# Patient Record
Sex: Female | Born: 1970 | Race: Black or African American | Hispanic: No | Marital: Single | Smoking: Never smoker
Health system: Southern US, Community
[De-identification: ages and names within clinical notes are randomized; demographics above are authoritative.]

## PROBLEM LIST (undated history)

## (undated) DIAGNOSIS — H9190 Unspecified hearing loss, unspecified ear: Secondary | ICD-10-CM

## (undated) HISTORY — DX: Unspecified hearing loss, unspecified ear: H91.90

---

## 1999-10-25 HISTORY — PX: REDUCTION MAMMAPLASTY: SUR839

## 2000-10-24 HISTORY — PX: BREAST REDUCTION SURGERY: SHX8

## 2002-10-24 HISTORY — PX: WRIST SURGERY: SHX841

## 2017-09-17 ENCOUNTER — Emergency Department (HOSPITAL_COMMUNITY): Payer: Self-pay

## 2017-09-17 ENCOUNTER — Encounter (HOSPITAL_COMMUNITY): Payer: Self-pay

## 2017-09-17 ENCOUNTER — Other Ambulatory Visit: Payer: Self-pay

## 2017-09-17 ENCOUNTER — Emergency Department (HOSPITAL_COMMUNITY)
Admission: EM | Admit: 2017-09-17 | Discharge: 2017-09-17 | Disposition: A | Discharge status: Home / Self Care | Payer: Self-pay | Attending: Emergency Medicine | Admitting: Emergency Medicine

## 2017-09-17 DIAGNOSIS — H9192 Unspecified hearing loss, left ear: Secondary | ICD-10-CM | POA: Insufficient documentation

## 2017-09-17 DIAGNOSIS — R42 Dizziness and giddiness: Secondary | ICD-10-CM | POA: Insufficient documentation

## 2017-09-17 LAB — CBC WITH DIFFERENTIAL/PLATELET
Basophils Absolute: 0 10*3/uL (ref 0.0–0.1)
Basophils Relative: 0 %
EOS ABS: 0 10*3/uL (ref 0.0–0.7)
EOS PCT: 0 %
HCT: 36 % (ref 36.0–46.0)
Hemoglobin: 11.9 g/dL — ABNORMAL LOW (ref 12.0–15.0)
Lymphocytes Relative: 14 %
Lymphs Abs: 0.8 10*3/uL (ref 0.7–4.0)
MCH: 26.2 pg (ref 26.0–34.0)
MCHC: 33.1 g/dL (ref 30.0–36.0)
MCV: 79.1 fL (ref 78.0–100.0)
MONO ABS: 0.3 10*3/uL (ref 0.1–1.0)
MONOS PCT: 5 %
NEUTROS ABS: 4.7 10*3/uL (ref 1.7–7.7)
NEUTROS PCT: 81 %
PLATELETS: 300 10*3/uL (ref 150–400)
RBC: 4.55 MIL/uL (ref 3.87–5.11)
RDW: 15.4 % (ref 11.5–15.5)
WBC: 5.8 10*3/uL (ref 4.0–10.5)

## 2017-09-17 LAB — BASIC METABOLIC PANEL
ANION GAP: 8 (ref 5–15)
BUN: 10 mg/dL (ref 6–20)
CALCIUM: 8.8 mg/dL — AB (ref 8.9–10.3)
CO2: 23 mmol/L (ref 22–32)
CREATININE: 0.78 mg/dL (ref 0.44–1.00)
Chloride: 106 mmol/L (ref 101–111)
GFR calc Af Amer: >60 mL/min (ref >60)
GFR calc non Af Amer: >60 mL/min (ref >60)
Glucose, Bld: 119 mg/dL — ABNORMAL HIGH (ref 65–99)
Potassium: 3.8 mmol/L (ref 3.5–5.1)
SODIUM: 137 mmol/L (ref 135–145)

## 2017-09-17 LAB — URINALYSIS, ROUTINE W REFLEX MICROSCOPIC
BACTERIA UA: NONE SEEN
Bilirubin Urine: NEGATIVE
GLUCOSE, UA: NEGATIVE mg/dL
Ketones, ur: NEGATIVE mg/dL
LEUKOCYTES UA: NEGATIVE
NITRITE: NEGATIVE
PROTEIN: NEGATIVE mg/dL
SPECIFIC GRAVITY, URINE: 1.008 (ref 1.005–1.030)
pH: 8 (ref 5.0–8.0)

## 2017-09-17 LAB — I-STAT BETA HCG BLOOD, ED (MC, WL, AP ONLY)

## 2017-09-17 MED ORDER — MECLIZINE HCL 25 MG PO TABS: 25.0000 mg | ORAL_TABLET | Freq: Three times a day (TID) | ORAL | 0 refills | Status: DC | PRN

## 2017-09-17 MED ORDER — MECLIZINE HCL 25 MG PO TABS
25.0000 mg | ORAL_TABLET | Freq: Once | ORAL | Status: AC
  Administered 2017-09-17: 25 mg via ORAL
  Filled 2017-09-17: qty 1

## 2017-09-17 MED ORDER — CIPROFLOXACIN-DEXAMETHASONE 0.3-0.1 % OT SUSP: 4.0000 [drp] | Freq: Two times a day (BID) | OTIC | 0 refills | Status: DC

## 2017-09-17 MED ORDER — SODIUM CHLORIDE 0.9 % IV BOLUS (SEPSIS)
1000.0000 mL | Freq: Once | INTRAVENOUS | Status: AC
  Administered 2017-09-17: 1000 mL via INTRAVENOUS

## 2017-09-17 MED ORDER — ONDANSETRON HCL 4 MG/2ML IJ SOLN
4.0000 mg | Freq: Once | INTRAMUSCULAR | Status: AC
  Administered 2017-09-17: 4 mg via INTRAVENOUS
  Filled 2017-09-17: qty 2

## 2017-09-17 MED ORDER — ONDANSETRON HCL 4 MG PO TABS: 4.0000 mg | ORAL_TABLET | Freq: Four times a day (QID) | ORAL | 0 refills | Status: DC

## 2017-09-17 NOTE — ED Triage Notes (Signed)
Pt states that since last Sunday, she has been experiencing L sided otalgia and hearing difficulties. Now patient states that it is difficult to lift her head up. She also had 2 episodes of emesis today. Endorses chills, generalized body aches, and malaise. A&Ox4.

## 2017-09-17 NOTE — ED Notes (Signed)
Patient transported to CT 

## 2017-09-17 NOTE — ED Provider Notes (Signed)
[facility]-EMERGENCY DEPT Provider Note   CSN: [id number] Arrival date & time: 09/17/17  1807     History   Chief Complaint Chief Complaint  Patient presents with  . Otalgia  . Fatigue    HPI Madison Lawrence is a 46 y.o. female who presents with 1 week of left ear pain and decreased hearing.  Patient reports that today, she started experiencing some dizziness, nausea/vomiting.  Patient reports for the last week, she has had left ear pain.  She has tried some over-the-counter medications with no improvement.  Patient also reports decreased hearing sensation.  She describes as a "imbalance." She denies any tinnitus but states that she hears "a dull noisy silence." Patient reports that today, she started dispensing some dizziness that she describes as a room spinning sensation.  Additionally, patient had several episodes of vomiting.  Emesis was nonbloody, nonbilious.  Patient reports that dizziness is worsened with ambulating and with movement from supine to sitting.  Patient also reports movement and had exacerbate symptoms.  Patient reports subjective fever/chills.  She denies any chest pain, difficulty breathing, abdominal pain, speech difficulty, tinnitus, numbness/weakness of her arms or legs, vision changes.  The history is provided by the patient.    History reviewed. No pertinent past medical history.  There are no active problems to display for this patient.    The histories are not reviewed yet. Please review them in the "History" navigator section and refresh this SmartLink.  OB History    No data available       Home Medications    Prior to Admission medications   Medication Sig Start Date End Date Taking? Authorizing Provider  ibuprofen (ADVIL,MOTRIN) 200 MG tablet Take 200 mg by mouth every 6 (six) hours as needed for moderate pain.   Yes [provider]  ciprofloxacin-dexamethasone (CIPRODEX) OTIC suspension Place 4 drops into the left  ear 2 (two) times daily. 09/17/17   Maxwell CaulLayden, Lindsey A, PA-C  meclizine (ANTIVERT) 25 MG tablet Take 1 tablet (25 mg total) by mouth 3 (three) times daily as needed for dizziness. 09/17/17   Maxwell CaulLayden, Lindsey A, PA-C  ondansetron (ZOFRAN) 4 MG tablet Take 1 tablet (4 mg total) by mouth every 6 (six) hours. 09/17/17   Maxwell CaulLayden, Lindsey A, PA-C    Family History History reviewed. No pertinent family history.  Social History Social History   Tobacco Use  . Smoking status: Not on file  Substance Use Topics  . Alcohol use: Not on file  . Drug use: Not on file     Allergies   Patient has no known allergies.   Review of Systems Review of Systems  Constitutional: Negative for fever.  HENT: Positive for ear pain and hearing loss. Negative for tinnitus.   Respiratory: Negative for cough and shortness of breath.   Cardiovascular: Negative for chest pain.  Gastrointestinal: Positive for nausea and vomiting. Negative for abdominal pain.  Genitourinary: Negative for dysuria and hematuria.  Neurological: Positive for dizziness. Negative for headaches.     Physical Exam Updated Vital Signs BP (!) 164/100 (BP Location: Left Arm)   Pulse 76   Temp 98 F (36.7 C) (Oral)   Resp 18   Ht 5' 7.5" (1.715 m)   Wt 104.3 kg (230 lb)   LMP 09/17/2017   SpO2 100%   BMI 35.49 kg/m   Physical Exam  Constitutional: She is oriented to person, place, and time. She appears well-developed and well-nourished.  Sitting comfortably on examination  table  HENT:  Head: Normocephalic and atraumatic.  Mouth/Throat: Oropharynx is clear and moist and mucous membranes are normal.  Eyes: Conjunctivae, EOM and lids are normal. Pupils are equal, round, and reactive to light.  Horizontal nystagmus  Neck: Full passive range of motion without pain.  Cardiovascular: Normal rate, regular rhythm, normal heart sounds and normal pulses. Exam reveals no gallop and no friction rub.  No murmur heard. Pulmonary/Chest:  Effort normal and breath sounds normal.  Abdominal: Soft. Normal appearance. There is no tenderness. There is no rigidity and no guarding.  Musculoskeletal: Normal range of motion.  Neurological: She is alert and oriented to person, place, and time.  Cranial nerves III-XII intact Follows commands, Moves all extremities  5/5 strength to BUE and BLE  Sensation intact throughout all major nerve distributions Normal finger to nose. No dysdiadochokinesia. No pronator drift. No gait abnormalities  Negative Rhomberg No slurred speech. No facial droop.  With right ear covered, patient cannot hear me whispering from her left ear.  If I increase the volume, patient can hear me but states it sounds muffled.  Skin: Skin is warm and dry. Capillary refill takes less than 2 seconds.  Psychiatric: She has a normal mood and affect. Her speech is normal.  Nursing note and vitals reviewed.    ED Treatments / Results  Labs (all labs ordered are listed, but only abnormal results are displayed) Labs Reviewed  URINALYSIS, ROUTINE W REFLEX MICROSCOPIC - Abnormal; Notable for the following components:      Result Value   Color, Urine STRAW (*)    Hgb urine dipstick LARGE (*)    Squamous Epithelial / LPF 0-5 (*)    Crystals PRESENT (*)    All other components within normal limits  BASIC METABOLIC PANEL - Abnormal; Notable for the following components:   Glucose, Bld 119 (*)    Calcium 8.8 (*)    All other components within normal limits  CBC WITH DIFFERENTIAL/PLATELET - Abnormal; Notable for the following components:   Hemoglobin 11.9 (*)    All other components within normal limits  I-STAT BETA HCG BLOOD, ED ([facility], [facility], [facility] ONLY)    EKG  EKG Interpretation None       Radiology Ct Head Wo Contrast  Result Date: 09/17/2017 CLINICAL DATA:  Left-sided otalgia and hearing difficulty since last Sunday. Vomiting. Chills, body aches, and malaise. Vertigo. EXAM: CT HEAD WITHOUT CONTRAST TECHNIQUE:  Contiguous axial images were obtained from the base of the skull through the vertex without intravenous contrast. COMPARISON:  None. FINDINGS: Brain: No evidence of acute infarction, hemorrhage, hydrocephalus, extra-axial collection or mass lesion/mass effect. Vascular: No hyperdense vessel or unexpected calcification. Skull: Normal. Negative for fracture or focal lesion. Sinuses/Orbits: Paranasal sinuses and mastoid air cells are clear. There is mild soft tissue thickening in the left middle 8 year without complete opacification. This could represent inflammatory change. Other: None. IMPRESSION: No acute intracranial abnormalities. Mucosal thickening in the left middle ear may represent inflammatory process. No air-fluid levels. Electronically Signed   By: Burman NievesWilliam  Stevens M.D.   On: 09/17/2017 22:49    Procedures Procedures (including critical care time)  Medications Ordered in ED Medications  sodium chloride 0.9 % bolus 1,000 mL (0 mLs Intravenous Stopped 09/17/17 2143)  ondansetron (ZOFRAN) injection 4 mg (4 mg Intravenous Given 09/17/17 1955)  meclizine (ANTIVERT) tablet 25 mg (25 mg Oral Given 09/17/17 2142)     Initial Impression / Assessment and Plan / ED Course  I have  reviewed the triage vital signs and the nursing notes.  Pertinent labs & imaging results that were available during my care of the patient were reviewed by me and considered in my medical decision making (see chart for details).     46 year old female who presents with 1 week of left-sided ear pain and hearing loss and dizziness, nausea and vomiting that began today.  Patient reports dizziness is a room spinning sensation and is worse with movement of her head. Patient is afebrile, non-toxic appearing, sitting comfortably on examination table. Vital signs reviewed. Patient is hypertensive.  Last time she was seen by primary care doctor, states that she was borderline.  She is on high blood pressure medication.  She does  exhibit some horizontal nystagmus.  Otherwise no abnormal deficits.  Consider vertigo versus dehydration is Mnire's versus labyrinthitis.  History/physical exam and on concerning for mastoiditis or CVA.  Labs and imaging reviewed.  Beta negative.  UA shows hemoglobin.  Patient reports that she is just started her menstrual cycle.  BMP unremarkable.  Low hemoglobin.  No previous for comparison.  Evaluation after medications.  Patient reports that nausea/vomiting have improved.  Patient also reports improvement in dizziness after meclizine.  CT head pending.  Reevaluation.  Patient is able to ambulate in the department without any difficulty.  She denies any symptoms with ambulation.  She has not had any more nausea or vomiting since medications.  She is able to eat in the department without any difficulty.  CT head pending.  CT head negative for any acute abnormality.  Discussed results with patient.  She reports improvement in dizziness and nausea/vomiting.  Able to tolerate p.o. in the part without any difficulty.  Vital signs reviewed.  Patient is still hypertensive.  She does not have a history of hypertension.  She states that she has been seen by her primary care doctor and told that she was borderline hypertensive and but she has not seen him in over a year.  She is not on any medications.  Encourage primary care follow-up for evaluation of blood pressure.  Instructed patient to follow-up with ENT regarding your symptoms and possible evaluation for Mnire's disease. Patient had ample opportunity for questions and discussion. All patient's questions were answered with full understanding. Strict return precautions discussed. Patient expresses understanding and agreement to plan.    Final Clinical Impressions(s) / ED Diagnoses   Final diagnoses:  Vertigo    ED Discharge Orders        Ordered    meclizine (ANTIVERT) 25 MG tablet  3 times daily PRN     09/17/17 2301    ondansetron (ZOFRAN) 4  MG tablet  Every 6 hours     09/17/17 2301    ciprofloxacin-dexamethasone (CIPRODEX) OTIC suspension  2 times daily     09/17/17 2301       Rosana Hoes 09/19/17 1632    Lorre Nick, MD 09/20/17 [phone]

## 2017-09-17 NOTE — Discharge Instructions (Signed)
Use Zofran as needed for nausea and vomiting.  Take meclizine as needed for dizziness.  You can use the eardrops as directed.  Follow-up with referred ENT doctor for further evaluation.  Call their office and arrange for an appointment.  I have included information regarding both vertigo and Mnire's disease.  Return the emergency department any fever, worsening dizziness, vision changes, persistent vomiting despite medications or any other worsening or concerning symptoms.

## 2018-08-09 ENCOUNTER — Ambulatory Visit (INDEPENDENT_AMBULATORY_CARE_PROVIDER_SITE_OTHER): Payer: Medicaid Other | Admitting: Obstetrics

## 2018-08-09 ENCOUNTER — Other Ambulatory Visit (HOSPITAL_COMMUNITY)
Admission: RE | Admit: 2018-08-09 | Discharge: 2018-08-09 | Disposition: A | Discharge status: Home / Self Care | Payer: Medicaid Other | Source: Ambulatory Visit | Attending: Obstetrics | Admitting: Obstetrics

## 2018-08-09 ENCOUNTER — Encounter: Payer: Self-pay | Admitting: Obstetrics

## 2018-08-09 VITALS — BP 142/92 | HR 57 | Ht 67.5 in | Wt 237.6 lb

## 2018-08-09 DIAGNOSIS — Z01419 Encounter for gynecological examination (general) (routine) without abnormal findings: Secondary | ICD-10-CM | POA: Diagnosis not present

## 2018-08-09 DIAGNOSIS — Z1239 Encounter for other screening for malignant neoplasm of breast: Secondary | ICD-10-CM

## 2018-08-09 DIAGNOSIS — E669 Obesity, unspecified: Secondary | ICD-10-CM

## 2018-08-09 DIAGNOSIS — Z0000 Encounter for general adult medical examination without abnormal findings: Secondary | ICD-10-CM | POA: Diagnosis not present

## 2018-08-09 NOTE — Progress Notes (Signed)
Subjective:        Madison Lawrence is a 47 y.o. female here for a routine exam.  Current complaints: None.    Personal health questionnaire:  Is patient Ashkenazi Jewish, have a family history of breast and/or ovarian cancer: no Is there a family history of uterine cancer diagnosed at age < 39, gastrointestinal cancer, urinary tract cancer, family member who is a Personnel officer syndrome-associated carrier: no Is the patient overweight and hypertensive, family history of diabetes, personal history of gestational diabetes, preeclampsia or PCOS: no Is patient over 43, have PCOS,  family history of premature CHD under age 12, diabetes, smoke, have hypertension or peripheral artery disease:  no At any time, has a partner hit, kicked or otherwise hurt or frightened you?: no Over the past 2 weeks, have you felt down, depressed or hopeless?: no Over the past 2 weeks, have you felt little interest or pleasure in doing things?:no   Gynecologic History No LMP recorded. Contraception: condoms Last Pap: 2018. Results were: normal Last mammogram: 2018. Results were: normal  Obstetric History OB History  Gravida Para Term Preterm AB Living  0 0 0 0 0 0  SAB TAB Ectopic Multiple Live Births  0 0 0 0 0    Past Medical History:  Diagnosis Date  . Hearing loss     Past Surgical History:  Procedure Laterality Date  . BREAST REDUCTION SURGERY  2002  . WRIST SURGERY  2004   carpel tunnel     Current Outpatient Medications:  .  meclizine (ANTIVERT) 25 MG tablet, Take 1 tablet (25 mg total) by mouth 3 (three) times daily as needed for dizziness., Disp: 30 tablet, Rfl: 0 .  ondansetron (ZOFRAN) 4 MG tablet, Take 1 tablet (4 mg total) by mouth every 6 (six) hours., Disp: 12 tablet, Rfl: 0 .  ciprofloxacin-dexamethasone (CIPRODEX) OTIC suspension, Place 4 drops into the left ear 2 (two) times daily. (Patient not taking: Reported on 08/09/2018), Disp: 7.5 mL, Rfl: 0 .  ibuprofen (ADVIL,MOTRIN) 200 MG  tablet, Take 200 mg by mouth every 6 (six) hours as needed for moderate pain., Disp: , Rfl:  No Known Allergies  Social History   Tobacco Use  . Smoking status: Never Smoker  . Smokeless tobacco: Never Used  Substance Use Topics  . Alcohol use: Not Currently    Family History  Problem Relation Age of Onset  . High blood pressure Mother   . Diabetes Mother   . Diabetes Father       Review of Systems  Constitutional: negative for fatigue and weight loss Respiratory: negative for cough and wheezing Cardiovascular: negative for chest pain, fatigue and palpitations Gastrointestinal: negative for abdominal pain and change in bowel habits Musculoskeletal:negative for myalgias Neurological: negative for gait problems and tremors Behavioral/Psych: negative for abusive relationship, depression Endocrine: negative for temperature intolerance    Genitourinary:negative for abnormal menstrual periods, genital lesions, hot flashes, sexual problems and vaginal discharge Integument/breast: negative for breast lump, breast tenderness, nipple discharge and skin lesion(s)    Objective:       BP (!) 142/92   Pulse (!) 57   Ht 5' 7.5" (1.715 m)   Wt 237 lb 9.6 oz (107.8 kg)   BMI 36.66 kg/m  General:   alert  Skin:   no rash or abnormalities  Lungs:   clear to auscultation bilaterally  Heart:   regular rate and rhythm, S1, S2 normal, no murmur, click, rub or gallop  Breasts:   normal  without suspicious masses, skin or nipple changes or axillary nodes  Abdomen:  normal findings: no organomegaly, soft, non-tender and no hernia  Pelvis:  External genitalia: normal general appearance Urinary system: urethral meatus normal and bladder without fullness, nontender Vaginal: normal without tenderness, induration or masses Cervix: normal appearance Adnexa: normal bimanual exam Uterus: anteverted and non-tender, normal size   Lab Review Urine pregnancy test Labs reviewed yes Radiologic  studies reviewed yes  50% of 20 min visit spent on counseling and coordination of care.   Assessment:     1. Encounter for routine gynecological examination with Papanicolaou smear of cervix Rx: - Cytology - PAP  2. Screening breast examination Rx: - MM Digital Screening; Future  3. Obesity (BMI 35.0-39.9 without comorbidity) - program of caloric reduction, exercise and behavioral modification recommended    Plan:    Education reviewed: calcium supplements, depression evaluation, low fat, low cholesterol diet, safe sex/STD prevention, self breast exams and weight bearing exercise. Contraception: condoms. Mammogram ordered. Follow up in: 1 year.   No orders of the defined types were placed in this encounter.  Orders Placed This Encounter  Procedures  . MM Digital Screening    Standing Status:   Future    Standing Expiration Date:   10/10/2019    Order Specific Question:   Reason for Exam (SYMPTOM  OR DIAGNOSIS REQUIRED)    Answer:   Screening    Order Specific Question:   Is the patient pregnant?    Answer:   No    Order Specific Question:   Preferred imaging location?    Answer:   [facility] Julio Alm MD 08-09-2018

## 2018-08-09 NOTE — Progress Notes (Signed)
Pt is here for annual. Pt reports last pap 11/2016 normal. Last MMG 2018- normal.

## 2018-08-13 LAB — CYTOLOGY - PAP
Diagnosis: NEGATIVE
HPV (WINDOPATH): NOT DETECTED

## 2018-08-20 ENCOUNTER — Other Ambulatory Visit (HOSPITAL_COMMUNITY): Payer: Self-pay | Admitting: *Deleted

## 2018-08-20 DIAGNOSIS — Z1231 Encounter for screening mammogram for malignant neoplasm of breast: Secondary | ICD-10-CM

## 2018-11-13 ENCOUNTER — Ambulatory Visit (HOSPITAL_COMMUNITY): Payer: Medicaid Other

## 2018-11-14 ENCOUNTER — Other Ambulatory Visit (HOSPITAL_COMMUNITY): Payer: Self-pay | Admitting: *Deleted

## 2018-11-14 DIAGNOSIS — Z1231 Encounter for screening mammogram for malignant neoplasm of breast: Secondary | ICD-10-CM

## 2019-02-21 ENCOUNTER — Ambulatory Visit (HOSPITAL_COMMUNITY): Payer: Medicaid Other

## 2019-06-13 ENCOUNTER — Other Ambulatory Visit: Payer: Self-pay

## 2019-06-13 ENCOUNTER — Ambulatory Visit
Admission: RE | Admit: 2019-06-13 | Discharge: 2019-06-13 | Disposition: A | Discharge status: Home / Self Care | Payer: No Typology Code available for payment source | Source: Ambulatory Visit | Attending: Obstetrics and Gynecology | Admitting: Obstetrics and Gynecology

## 2019-06-13 ENCOUNTER — Ambulatory Visit (HOSPITAL_COMMUNITY)
Admission: RE | Admit: 2019-06-13 | Discharge: 2019-06-13 | Disposition: A | Discharge status: Home / Self Care | Payer: Medicaid Other | Source: Ambulatory Visit | Attending: Obstetrics and Gynecology | Admitting: Obstetrics and Gynecology

## 2019-06-13 ENCOUNTER — Encounter (HOSPITAL_COMMUNITY): Payer: Self-pay

## 2019-06-13 DIAGNOSIS — Z1239 Encounter for other screening for malignant neoplasm of breast: Secondary | ICD-10-CM | POA: Insufficient documentation

## 2019-06-13 DIAGNOSIS — Z1231 Encounter for screening mammogram for malignant neoplasm of breast: Secondary | ICD-10-CM

## 2019-06-13 NOTE — Patient Instructions (Signed)
Explained breast self awareness with Porfirio Oar. Patient did not need a Pap smear today due to last Pap smear and HPV typing was 08/09/2018. Let her know [facility] will cover Pap smears and HPV typing every 5 years unless has a history of abnormal Pap smears. Referred patient to the [facility] for a screening mammogram. Appointment scheduled for Thursday, June 13, 2019 at 1040. Patient aware of appointment and will be there. Let patient know the [facility] will follow up with her within the next couple weeks with results of mammogram by letter or phone. Meliana Douglas verbalized understanding.  Brannock, Arvil Chaco, RN 11:54 AM

## 2019-06-13 NOTE — Progress Notes (Signed)
No complaints today.   Pap Smear: Pap smear not completed today. Last Pap smear was 08/09/2018 at [facility] and normal with negative HPV. Per patient has no history of an abnormal Pap smear. Last Pap smear result is in Epic.  Physical exam: Breasts Breasts symmetrical. Scars observed lower bilateral breasts due to history of breast reduction surgery. No nipple retraction bilateral breasts. No nipple discharge bilateral breasts. No lymphadenopathy. No lumps palpated bilateral breasts. No complaints of pain or tenderness on exam. Referred patient to the [facility] for a screening mammogram. Appointment scheduled for Thursday, June 13, 2019 at 1040.        Pelvic/Bimanual No Pap smear completed today since last Pap smear and HPV typing was 08/09/2018. Pap smear not indicated per B[facility]CP guidelines.   Smoking History: Patient has never smoked.  Patient Navigation: Patient education provided. Access to services provided for patient through [facility]CP program.   Breast and Cervical Cancer Risk Assessment: Patient has no family history of breast cancer, known genetic mutations, or radiation treatment to the chest before age 68. Patient has no history of cervical dysplasia, immunocompromised, or DES exposure in-utero.  Risk Assessment    Risk Scores      06/13/2019   Last edited by: Armond Hang, LPN   5-year risk: 0.7 %   Lifetime risk: 7.1 %

## 2019-06-18 ENCOUNTER — Encounter (HOSPITAL_COMMUNITY): Payer: Self-pay | Admitting: *Deleted

## 2019-07-08 ENCOUNTER — Other Ambulatory Visit: Payer: Self-pay | Admitting: Obstetrics and Gynecology

## 2019-07-08 ENCOUNTER — Other Ambulatory Visit: Payer: Self-pay

## 2019-07-08 ENCOUNTER — Inpatient Hospital Stay: Payer: Self-pay | Attending: Obstetrics and Gynecology | Admitting: *Deleted

## 2019-07-08 VITALS — BP 152/98 | Temp 97.1°F | Ht 66.5 in | Wt 258.0 lb

## 2019-07-08 DIAGNOSIS — Z0000 Encounter for general adult medical examination without abnormal findings: Secondary | ICD-10-CM

## 2019-07-08 NOTE — Progress Notes (Signed)
[facility] initial screening     Clinical Measurement:  Height: 66.5 in Weight: 258 lb  Blood Pressure: 150/102  Blood Pressure #2: 152/98 Fasting Labs Drawn Today, will review with patient when they result.   Medical History:  Patient states that she has a history of high blood pressure. Patient does not have a history of high cholesterol or diabetes.  Medications:  Patient states that she does not take  medication to lower cholesterol, blood pressure or blood sugar.  Patient does not take an aspirin a day to help prevent a heart attack or stroke.    Blood pressure, self measurement: Patient states that she does measure blood pressure from home and has not been told to do so.   Nutrition: Patient states that on average she eats 2  cups of fruit and 2 cups of vegetables per day. Patient states that she does not eat fish at least 2 times per week. Patient eats about half servings of whole grains. Patient does not drink less than 36 ounces of beverages with added sugar weekly. Patient is not currently watching sodium or salt intake. In the past 7 days patient has drank alcohol on two days. On average patient drinks 1 drink containing alcohol on days when she consumes alcohol.  Physical activity:  Patient states that she gets 90 minutes of moderate and 0 minutes of vigorous physical activity each week.  Smoking status:  Patient states that she has never smoked tobacco.   Quality of life:  Over the past 2 weeks patient states that she has not had any days where she has little interest or pleasure in doing things and 0 days where she has felt down, depressed or hopeless.    Risk reduction and counseling:    Health Coaching: Informed patient that since her BP was elevated today I would be referring her for follow-up with Internal Medicine.   Encouraged patient to try and add an extra serving of vegetables into daily diet. Also spoke with patient about reducing the amount of beverages with added  sugars. Patient states that she currently drinks at least 5 glasses of juice a day. Explained that the recommendation is less than 36 ounces or less of beverages with added sugars weekly. I also talked to the patient about watching the amount of sodium that she consumes. Explained to the patient that too much sodium can cause blood pressure to be elevated. Encouraged patient to also try and walk for 20 minutes a day.   Navigation:  I will notify patient of lab results.  Patient is aware of 2 more health coaching sessions and a follow up.  Time: 20 minutes

## 2019-07-09 LAB — GLUCOSE, RANDOM: Glucose: 102 mg/dL — ABNORMAL HIGH (ref 65–99)

## 2019-07-09 LAB — LIPID PANEL W/O CHOL/HDL RATIO
Cholesterol, Total: 196 mg/dL (ref 100–199)
HDL: 51 mg/dL (ref >39)
LDL Chol Calc (NIH): 122 mg/dL — ABNORMAL HIGH (ref 0–99)
Triglycerides: 131 mg/dL (ref 0–149)
VLDL Cholesterol Cal: 23 mg/dL (ref 5–40)

## 2019-07-09 LAB — HGB A1C W/O EAG: Hgb A1c MFr Bld: 5.7 % — ABNORMAL HIGH (ref 4.8–5.6)

## 2019-07-15 ENCOUNTER — Telehealth: Payer: Self-pay

## 2019-07-15 NOTE — Telephone Encounter (Signed)
Left message for patient about [facility] lab results. Left name and number for patient to call back. 

## 2019-07-16 ENCOUNTER — Telehealth: Payer: Self-pay

## 2019-07-16 NOTE — Telephone Encounter (Signed)
Health coaching 2    Labs-196 cholesterol ,  122 LDL cholesterol , 131 triglycerides , 51 HDL cholesterol , 5.7 hemoglobin A1C , 102 mean plasma glucose   Patient understands and is aware of her lab results.   Goals-  Spoke with patient about [facility] lab results. Answered any questions that patient had regarding results. Informed patient that since her BP was elevated during her initial visit and since her hemoglobin A1C and glucose were elevated I would be referring her to [facility] for follow-up.  Goals- Reduce the amount of sweetened beverages that she consumes. Encouraged patient to try and drink less than 36 oz per week. Also encouraged patient to watch the amounts of sweets and sugars that she consumes. Reduce the amount of fried and fatty foods that she consumes. Patient also stated that she has been walking more. Encouraged patient to try and walk for 20 minutes a day.   Navigation:  Patient is aware of 1 more health coaching sessions and a follow up. Patient is scheduled with [facility] on September 30th @ 8:45 am.  Time- 10 minutes

## 2019-07-18 ENCOUNTER — Ambulatory Visit: Payer: No Typology Code available for payment source

## 2019-07-23 ENCOUNTER — Ambulatory Visit: Payer: No Typology Code available for payment source

## 2019-07-24 ENCOUNTER — Ambulatory Visit (INDEPENDENT_AMBULATORY_CARE_PROVIDER_SITE_OTHER): Payer: Self-pay | Admitting: Internal Medicine

## 2019-07-24 ENCOUNTER — Other Ambulatory Visit: Payer: Self-pay

## 2019-07-24 VITALS — BP 160/91 | HR 60 | Temp 98.6°F | Ht 67.5 in | Wt 257.4 lb

## 2019-07-24 DIAGNOSIS — Z0000 Encounter for general adult medical examination without abnormal findings: Secondary | ICD-10-CM

## 2019-07-24 DIAGNOSIS — Z23 Encounter for immunization: Secondary | ICD-10-CM

## 2019-07-24 DIAGNOSIS — I10 Essential (primary) hypertension: Secondary | ICD-10-CM

## 2019-07-24 MED ORDER — AMLODIPINE BESYLATE 5 MG PO TABS: 5.0000 mg | ORAL_TABLET | Freq: Every day | ORAL | 3 refills | Status: DC

## 2019-07-24 NOTE — Patient Instructions (Signed)
Ms. Marovich,  It was nice meeting you! You're blood pressure was elevated today, so I have sent in a prescription for amlodipine to your [facility]. We have gotten some additional blood work from you today, and I will call you with any abnormal results. Please follow-up in 2-3 months to recheck your blood pressure.

## 2019-07-24 NOTE — Assessment & Plan Note (Signed)
Pt received influenza vaccine today. Up to date on PAP smear (08/09/18) and mammogram (06/13/19), both negative.

## 2019-07-24 NOTE — Assessment & Plan Note (Signed)
Pt endorses history of "borderline hypertension" years ago, which was previously controlled with diet and exercise after the pt lost 60lbs. She describes her weight fluctuating since then due to decreased exercise and drinking a lot of sugary beverages. Her BP in the office today is elevated to 160/91. She is interested in making lifestyle modifications again and has begun walking around her apartment complex in the mornings. Discussed with the pt the recommendation to treat the blood pressure with medication while she is also making these lifestyle changes, and the pt was agreeable to this plan.  Assessment - Essential hypertension  Plan - start amlodipine 5mg  daily - ordered BMP today - follow-up in 2 months for BP check  BP Readings from Last 3 Encounters:  07/24/19 (!) 160/91  07/08/19 (!) 152/98  06/13/19 (!) 162/96

## 2019-07-24 NOTE — Progress Notes (Signed)
   CC: elevated BP reading  HPI:  Ms.Davelyn Kohl is a 48 y.o. F with significant PMH as outlined below who presents today tp establish care and follow-up on elevated blood pressure readings. Please see problem-based charting for additional information.  Past Medical History:  Diagnosis Date  . Hearing loss    Social History: Never cigarette smoker, occasional cigar use. Drink EtOH socially, approx once per week. Denies any other substance use. 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   Family History: Mom - HTN, diabetes Dad - diabetes No FH of hyperlipidemia, heart disease, kidney disease, or cancers.  Review of Systems:   Review of Systems  Constitutional: Negative for chills and fever.  HENT: Negative for congestion, sinus pain and sore throat.   Eyes: Negative for blurred vision and double vision.  Respiratory: Negative for cough and shortness of breath.   Cardiovascular: Negative for chest pain.  Gastrointestinal: Negative for abdominal pain, nausea and vomiting.  Genitourinary: Negative for dysuria.  Musculoskeletal: Negative for back pain and joint pain.  Skin: Negative.   Neurological: Negative for dizziness and headaches.   Physical Exam:  Vitals:   07/24/19 0845 07/24/19 0851  BP: (!) 174/110 (!) 160/91  Pulse: (!) 59 60  Temp: 98.6 F (37 C)   TempSrc: Oral   SpO2: 100%   Weight: 257 lb 6.4 oz (116.8 kg)   Height: 5' 7.5" (1.715 m)    Physical Exam Vitals signs and nursing note reviewed.  Constitutional:      General: She is not in acute distress.    Appearance: Normal appearance.  HENT:     Head: Normocephalic and atraumatic.  Neck:     Musculoskeletal: Normal range of motion and neck supple.  Cardiovascular:     Rate and Rhythm: Normal rate and regular rhythm.     Heart sounds: Normal heart sounds.  Pulmonary:     Effort: Pulmonary effort is normal. No respiratory distress.     Breath sounds: Normal breath sounds.  No wheezing, rhonchi or rales.  Abdominal:     General: Abdomen is flat. Bowel sounds are normal.     Palpations: Abdomen is soft.  Musculoskeletal: Normal range of motion.  Skin:    General: Skin is warm and dry.  Neurological:     Mental Status: She is alert.  Psychiatric:        Mood and Affect: Mood normal.    Assessment & Plan:   See Encounters Tab for problem based charting.  Patient seen with Dr. Evette Doffing

## 2019-07-25 LAB — BMP8+ANION GAP
Anion Gap: 12 mmol/L (ref 10.0–18.0)
BUN/Creatinine Ratio: 10 (ref 9–23)
BUN: 11 mg/dL (ref 6–24)
CO2: 23 mmol/L (ref 20–29)
Calcium: 9.3 mg/dL (ref 8.7–10.2)
Chloride: 104 mmol/L (ref 96–106)
Creatinine, Ser: 1.05 mg/dL — ABNORMAL HIGH (ref 0.57–1.00)
GFR calc Af Amer: 73 mL/min/{1.73_m2} (ref >59)
GFR calc non Af Amer: 63 mL/min/{1.73_m2} (ref >59)
Glucose: 96 mg/dL (ref 65–99)
Potassium: 4.9 mmol/L (ref 3.5–5.2)
Sodium: 139 mmol/L (ref 134–144)

## 2019-07-25 LAB — CBC
Hematocrit: 39.8 % (ref 34.0–46.6)
Hemoglobin: 12.4 g/dL (ref 11.1–15.9)
MCH: 25.8 pg — ABNORMAL LOW (ref 26.6–33.0)
MCHC: 31.2 g/dL — ABNORMAL LOW (ref 31.5–35.7)
MCV: 83 fL (ref 79–97)
Platelets: 302 10*3/uL (ref 150–450)
RBC: 4.81 x10E6/uL (ref 3.77–5.28)
RDW: 14.1 % (ref 11.7–15.4)
WBC: 3.8 10*3/uL (ref 3.4–10.8)

## 2019-07-25 NOTE — Progress Notes (Signed)
Internal Medicine Clinic Attending  I saw and evaluated the patient.  I personally confirmed the key portions of the history and exam documented by Dr. Jones and I reviewed pertinent patient test results.  The assessment, diagnosis, and plan were formulated together and I agree with the documentation in the resident's note.     

## 2019-11-23 ENCOUNTER — Other Ambulatory Visit: Payer: Self-pay | Admitting: Internal Medicine

## 2019-12-12 ENCOUNTER — Other Ambulatory Visit: Payer: Self-pay | Admitting: Internal Medicine

## 2019-12-23 ENCOUNTER — Other Ambulatory Visit: Payer: Self-pay

## 2019-12-23 ENCOUNTER — Encounter: Payer: Self-pay | Admitting: Internal Medicine

## 2019-12-23 ENCOUNTER — Ambulatory Visit (INDEPENDENT_AMBULATORY_CARE_PROVIDER_SITE_OTHER): Payer: Self-pay | Admitting: Internal Medicine

## 2019-12-23 VITALS — BP 189/94 | HR 56 | Temp 98.3°F | Ht 67.5 in | Wt 258.1 lb

## 2019-12-23 DIAGNOSIS — Z79899 Other long term (current) drug therapy: Secondary | ICD-10-CM

## 2019-12-23 DIAGNOSIS — Z713 Dietary counseling and surveillance: Secondary | ICD-10-CM

## 2019-12-23 DIAGNOSIS — I10 Essential (primary) hypertension: Secondary | ICD-10-CM

## 2019-12-23 DIAGNOSIS — E669 Obesity, unspecified: Secondary | ICD-10-CM | POA: Insufficient documentation

## 2019-12-23 DIAGNOSIS — Z6839 Body mass index (BMI) 39.0-39.9, adult: Secondary | ICD-10-CM

## 2019-12-23 MED ORDER — AMLODIPINE BESYLATE 5 MG PO TABS
5.0000 mg | ORAL_TABLET | Freq: Every day | ORAL | 1 refills | Status: DC
Start: 2019-12-23 — End: 2020-02-04

## 2019-12-23 MED ORDER — AMLODIPINE BESYLATE 10 MG PO TABS: 10.0000 mg | ORAL_TABLET | Freq: Every day | ORAL | 1 refills | Status: DC

## 2019-12-23 NOTE — Progress Notes (Signed)
Internal Medicine Clinic Attending  Case discussed with Dr. Harbrecht at the time of the visit.  We reviewed the resident's history and exam and pertinent patient test results.  I agree with the assessment, diagnosis, and plan of care documented in the resident's note.   

## 2019-12-23 NOTE — Assessment & Plan Note (Signed)
Hypertension: Patient's BP today is 182/90 and 189/94 on repeat with a goal of <140/80. The patient endorses adherence to her medication regimen with the caveat that she ran out of her medication ~6 days prior. She denied, chest pain, headache, visual changes, lightheadedness, weakness, dizziness on standing, swelling in the feet or ankles.  The prior record blood pressure in December gynecology or her BP was recorded 126/86.  As such I feel the amlodipine 5 mg may have been effective and assume that today's readings are extraordinarily elevated.  Plan: Continue Amlodipine 5mg  daily refilled for 6 months We discussed picking up a sphygmomanometer to record home BP readings. She has agreed to call in approximately 4 to 6 weeks with her home readings at which time we can decide to increase amlodipine or to continue the 5 mg dose A follow-up appointment can be scheduled at that time as well

## 2019-12-23 NOTE — Assessment & Plan Note (Signed)
BMI 39.0-39.9: Discussed ideas regarding exercise and weight loss. She really seems to have the correct dietary and exercise changes in mind. She stated that it simply comes down to being able to make the changes. I encouraged her to make small changes and keep them consistent.   Plan: She is advised to walk daily at least 5-6 times per week She likes the idea of small portion eating I encouraged eating low calorie dense foods to keep full such as vegetables, fruits, salads, chicken and fish.

## 2019-12-23 NOTE — Progress Notes (Signed)
   CC: High blood pressure  HPI:[name] is a 49 y.o. female who presents for evaluation of her high blood pressure treatment. Please see individual problem based A/P for details.  Past Medical History:  Diagnosis Date  . Hearing loss    Review of Systems:   ROS negative except as per HPI.  Physical Exam: Vitals:   12/23/19 0839 12/23/19 0915  BP: (!) 182/90 (!) 189/94  Pulse: (!) 58 (!) 56  Temp: 98.3 F (36.8 C)   TempSrc: Oral   SpO2: 100%   Weight: 258 lb 1.6 oz (117.1 kg)   Height: 5' 7.5" (1.715 m)    Filed Weights   12/23/19 0839  Weight: 258 lb 1.6 oz (117.1 kg)   General: A/O x4, in no acute distress, afebrile, nondiaphoretic HEENT: PEERL, EMO intact Cardio: RRR, no mrg's  Pulmonary: CTA bilaterally, no wheezing or crackles  MSK: BLE nontender, nonedematous Neuro: Alert, conversational, normal gait Psych: Appropriate affect, not depressed in appearance, engages well  Assessment & Plan:   See Encounters Tab for problem based charting.  Patient discussed with Dr. Rogelia Boga

## 2019-12-23 NOTE — Patient Instructions (Addendum)
FOLLOW-UP INSTRUCTIONS When: Call us in about 4-6 weeks with a log of your blood pressure readings at least twice daily.  Return in 6 months or sooner if BP remains elevated What to bring: All of your medications and your blood pressure machine  I have continued the Amlodipine at 5mg  daily.  Today we discussed high blood pressure and weight loss as well as exercise. I do believe that is is important to treat high blood pressure with a medication while you continue the life style changes. Changing how we eat is very difficult but it does sound like you have the right ideas. I agree with walking daily and or an elliptical.   Thank you for your visit to the [facility] today. If you have any questions or concerns please call [facility] at [phone].

## 2020-02-03 ENCOUNTER — Other Ambulatory Visit: Payer: Self-pay

## 2020-02-03 ENCOUNTER — Encounter: Payer: Self-pay | Admitting: Internal Medicine

## 2020-02-03 ENCOUNTER — Telehealth: Payer: Self-pay

## 2020-02-03 NOTE — Telephone Encounter (Signed)
Left message for patient about completing HC 3 for the [facility] program. Left name and number for patient to call back. 

## 2020-02-03 NOTE — Progress Notes (Signed)
This encounter was created in error - please disregard.

## 2020-02-04 ENCOUNTER — Encounter: Payer: Self-pay | Admitting: Internal Medicine

## 2020-02-04 ENCOUNTER — Ambulatory Visit (INDEPENDENT_AMBULATORY_CARE_PROVIDER_SITE_OTHER): Payer: 59 | Admitting: Internal Medicine

## 2020-02-04 VITALS — BP 146/106 | HR 60 | Temp 98.0°F | Ht 67.5 in | Wt 256.8 lb

## 2020-02-04 DIAGNOSIS — E669 Obesity, unspecified: Secondary | ICD-10-CM

## 2020-02-04 DIAGNOSIS — Z79899 Other long term (current) drug therapy: Secondary | ICD-10-CM | POA: Diagnosis not present

## 2020-02-04 DIAGNOSIS — Z6839 Body mass index (BMI) 39.0-39.9, adult: Secondary | ICD-10-CM | POA: Diagnosis not present

## 2020-02-04 DIAGNOSIS — I10 Essential (primary) hypertension: Secondary | ICD-10-CM

## 2020-02-04 MED ORDER — AMLODIPINE BESYLATE 10 MG PO TABS: 10.0000 mg | ORAL_TABLET | Freq: Every day | ORAL | 1 refills | Status: DC

## 2020-02-04 NOTE — Assessment & Plan Note (Signed)
Pt presents today for HTN follow-up. She is asymptomatic and feeling well. Endorses taking amlodipine 5mg  daily. Denies chest pain, palpitations, shortness of breath, dizziness/lightheadednss, headaches, vision changes, or leg swelling. She does not check her BP at home. Pt states her job is creating stress in her life. Works for and has [employer]. In the office today, BP is 154/90 and 146/106 on repeat. This is much improved from last appointment where SBP >180, however not at goal <140/80.  - increase amlodipine to 10mg  daily - encouraged pt to continue lifestyle modifications with exercise and weight loss - clinic follow-up in 4-6 weeks for BP recheck  BP Readings from Last 3 Encounters:  02/04/20 (!) 146/106  12/23/19 (!) 189/94  07/24/19 (!) 160/91

## 2020-02-04 NOTE — Progress Notes (Signed)
   CC: hypertension follow-up  HPI:  [name] is a 49 y.o. F with significant PMH of HTN and obesity, who presents for HTN follow-up. Please see problem-based charting for additional information  Past Medical History:  Diagnosis Date  . Hearing loss    Review of Systems:   Review of Systems  Constitutional: Negative for chills and fever.  Eyes: Negative for blurred vision.  Respiratory: Negative for cough and shortness of breath.   Cardiovascular: Negative for chest pain, palpitations and leg swelling.  Gastrointestinal: Negative for diarrhea, nausea and vomiting.  Neurological: Negative for dizziness and headaches.   Physical Exam:  Vitals:   02/04/20 0925  BP: (!) 154/90  Pulse: 64  Temp: 98 F (36.7 C)  TempSrc: Oral  SpO2: 99%  Weight: 256 lb 12.8 oz (116.5 kg)  Height: 5' 7.5" (1.715 m)   Physical Exam Vitals and nursing note reviewed.  Constitutional:      General: She is not in acute distress.    Appearance: Normal appearance. She is not ill-appearing.  Cardiovascular:     Rate and Rhythm: Normal rate and regular rhythm.     Heart sounds: Normal heart sounds.  Pulmonary:     Effort: Pulmonary effort is normal.     Breath sounds: Normal breath sounds. No wheezing, rhonchi or rales.  Skin:    General: Skin is warm and dry.  Neurological:     Mental Status: She is alert.    Assessment & Plan:   See Encounters Tab for problem based charting.  Patient discussed with Dr. Rogelia Boga

## 2020-02-04 NOTE — Patient Instructions (Signed)
Madison Lawrence,  It was nice seeing you again today! I am glad you are feeling well.  For your blood pressure - please increase your daily amlodipine to 10mg . You may take two 5mg  tablets daily until you run out, and then a prescription for 10mg  tablets will be available for pick-up at your [facility] pharmacy.  Please follow-up in clinic in 4-6 weeks for another blood pressure check.  Thank you for letting be a part of your care!

## 2020-02-10 NOTE — Progress Notes (Signed)
Internal Medicine Clinic Attending  Case discussed with Dr. Jones at the time of the visit.  We reviewed the resident's history and exam and pertinent patient test results.  I agree with the assessment, diagnosis, and plan of care documented in the resident's note.  

## 2020-03-18 ENCOUNTER — Encounter: Payer: Self-pay | Admitting: Internal Medicine

## 2020-03-18 ENCOUNTER — Ambulatory Visit (INDEPENDENT_AMBULATORY_CARE_PROVIDER_SITE_OTHER): Payer: 59 | Admitting: Internal Medicine

## 2020-03-18 ENCOUNTER — Other Ambulatory Visit: Payer: Self-pay

## 2020-03-18 VITALS — BP 144/83 | HR 61 | Temp 98.3°F | Ht 67.0 in | Wt 251.8 lb

## 2020-03-18 DIAGNOSIS — R7303 Prediabetes: Secondary | ICD-10-CM | POA: Diagnosis not present

## 2020-03-18 DIAGNOSIS — I10 Essential (primary) hypertension: Secondary | ICD-10-CM | POA: Diagnosis not present

## 2020-03-18 LAB — POCT GLYCOSYLATED HEMOGLOBIN (HGB A1C): Hemoglobin A1C: 5.7 % — AB (ref 4.0–5.6)

## 2020-03-18 LAB — GLUCOSE, CAPILLARY: Glucose-Capillary: 103 mg/dL — ABNORMAL HIGH (ref 70–99)

## 2020-03-18 MED ORDER — HYDROCHLOROTHIAZIDE 12.5 MG PO CAPS: 12.5000 mg | ORAL_CAPSULE | Freq: Every day | ORAL | 1 refills | Status: DC

## 2020-03-18 NOTE — Patient Instructions (Addendum)
It was a pleasure seeing you in clinic. Today we discussed:   Hypertension: Please continue to take your amlodipine 10mg  daily as prescribed. I am also prescribing HCTZ 12.5mg  daily. Please continue with your healthy, low-sodium diet and regular exercise. I am obtaining some labs today. I will call you if there are any abnormalities. If you need any support, or have any questions, please let know. Follow up in 4-6 weeks for BP check.    If you have any questions or concerns, please call our clinic at [phone] between 9am-5pm and after hours call [phone] and ask for the internal medicine resident on call. If you feel you are having a medical emergency please call 911.   Thank you, we look forward to helping you remain healthy!  To schedule an appointment for a COVID vaccine or be added to the vaccine wait list: Go to [phone]   OR Go to [email or web address]                  OR Call [phone]                                     OR Call [phone] and select Option 2

## 2020-03-18 NOTE — Assessment & Plan Note (Signed)
Patient presenting today for hypertension follow-up.  Blood pressure today 144/83. she is asymptomatic and in good spirits.  She endorses taking amlodipine 10 mg daily.  She denies any headaches, vision changes, dizziness/lightheadedness, chest pain, palpitations, shortness of breath, leg swelling. BP Readings from Last 3 Encounters:  03/18/20 (!) 144/83  02/04/20 (!) 146/106  12/23/19 (!) 189/94   She is still working for [employer] and endorses that this does create some stress in her life.  However, she notes that she is dealing with the stress by creating a brighter home environment.  She also endorses regular exercise and is motivated for continued weight loss. Given her young age, goal BP 120/80.  Plan Continue amlodipine 10 mg daily Start hydrochlorothiazide 12.5 mg daily Encouraged for continue lifestyle modification with exercise and low-sodium diet BMP today

## 2020-03-18 NOTE — Progress Notes (Signed)
   CC: hypertension f/u  HPI:  Ms.Gilberto Rho is a 49 y.o. female with PMHx of hypertension and obesity presenting for follow up of her hypertension. No acute concerns today. Please see problem based charting for full assessment and plan.   Past Medical History:  Diagnosis Date  . Hearing loss    Review of Systems:  Negative except as stated in HPI.  Physical Exam:  Vitals:   03/18/20 0939  BP: (!) 144/83  Pulse: 61  Temp: 98.3 F (36.8 C)  TempSrc: Oral  SpO2: 100%  Weight: 251 lb 12.8 oz (114.2 kg)  Height: 5\' 7"  (1.702 m)   Physical Exam Constitutional:      Appearance: Normal appearance. She is not ill-appearing or diaphoretic.  HENT:     Mouth/Throat:     Mouth: Mucous membranes are moist.     Pharynx: Oropharynx is clear.  Cardiovascular:     Rate and Rhythm: Normal rate and regular rhythm.     Pulses: Normal pulses.     Heart sounds: Normal heart sounds.  Pulmonary:     Effort: Pulmonary effort is normal. No respiratory distress.     Breath sounds: Normal breath sounds. No wheezing.  Abdominal:     General: Bowel sounds are normal. There is no distension.     Palpations: Abdomen is soft.     Tenderness: There is no abdominal tenderness.  Skin:    General: Skin is warm and dry.     Capillary Refill: Capillary refill takes less than 2 seconds.  Neurological:     General: No focal deficit present.     Mental Status: She is alert and oriented to person, place, and time. Mental status is at baseline.      Assessment & Plan:   See Encounters Tab for problem based charting.  Patient discussed with Dr. 

## 2020-03-18 NOTE — Assessment & Plan Note (Signed)
Hemoglobin A1c 5.7.  Patient encouraged for lifestyle modification.  Plan Continue lifestyle modification with weight loss Lipid panel

## 2020-03-18 NOTE — Progress Notes (Signed)
Internal Medicine Clinic Attending  Case discussed with Dr. Aslam at the time of the visit.  We reviewed the resident's history and exam and pertinent patient test results.  I agree with the assessment, diagnosis, and plan of care documented in the resident's note.  

## 2020-03-19 LAB — LIPID PANEL
Chol/HDL Ratio: 4.6 ratio — ABNORMAL HIGH (ref 0.0–4.4)
Cholesterol, Total: 210 mg/dL — ABNORMAL HIGH (ref 100–199)
HDL: 46 mg/dL (ref >39)
LDL Chol Calc (NIH): 143 mg/dL — ABNORMAL HIGH (ref 0–99)
Triglycerides: 116 mg/dL (ref 0–149)
VLDL Cholesterol Cal: 21 mg/dL (ref 5–40)

## 2020-03-19 LAB — BMP8+ANION GAP
Anion Gap: 13 mmol/L (ref 10.0–18.0)
BUN/Creatinine Ratio: 15 (ref 9–23)
BUN: 12 mg/dL (ref 6–24)
CO2: 23 mmol/L (ref 20–29)
Calcium: 9 mg/dL (ref 8.7–10.2)
Chloride: 102 mmol/L (ref 96–106)
Creatinine, Ser: 0.79 mg/dL (ref 0.57–1.00)
GFR calc Af Amer: 103 mL/min/{1.73_m2} (ref >59)
GFR calc non Af Amer: 89 mL/min/{1.73_m2} (ref >59)
Glucose: 97 mg/dL (ref 65–99)
Potassium: 4.5 mmol/L (ref 3.5–5.2)
Sodium: 138 mmol/L (ref 134–144)

## 2020-05-22 ENCOUNTER — Other Ambulatory Visit: Payer: Self-pay | Admitting: Internal Medicine

## 2020-05-22 DIAGNOSIS — I10 Essential (primary) hypertension: Secondary | ICD-10-CM

## 2020-06-12 ENCOUNTER — Other Ambulatory Visit: Payer: Self-pay

## 2020-06-12 ENCOUNTER — Encounter: Payer: Self-pay | Admitting: Internal Medicine

## 2020-06-12 ENCOUNTER — Ambulatory Visit (INDEPENDENT_AMBULATORY_CARE_PROVIDER_SITE_OTHER): Payer: 59 | Admitting: Internal Medicine

## 2020-06-12 DIAGNOSIS — I10 Essential (primary) hypertension: Secondary | ICD-10-CM

## 2020-06-12 MED ORDER — HYDROCHLOROTHIAZIDE 12.5 MG PO CAPS: 12.5000 mg | ORAL_CAPSULE | Freq: Every day | ORAL | 1 refills | Status: DC

## 2020-06-12 NOTE — Patient Instructions (Addendum)
Thank you for trusting me with your care. To recap, today we discussed the following:   1. Essential hypertension  - hydrochlorothiazide (MICROZIDE) 12.5 MG capsule; Take 1 capsule (12.5 mg total) by mouth daily.  Dispense: 90 capsule; Refill: 1 - BMP8+Anion Gap  2. Prediabetes      - Hemoglobin A1c in 3 months

## 2020-06-12 NOTE — Progress Notes (Signed)
   CC: high blood pressure  HPI:[name] is a 49 y.o. female who presents for evaluation of HTN . Please see individual problem based A/P for details.   Depression, PHQ-9: Based on the patients    Office Visit from 03/18/2020 in [facility]  PHQ-9 Total Score 1     score we have is negative for depresson.  Past Medical History:  Diagnosis Date  . Hearing loss    Review of Systems:   Review of Systems  Constitutional: Positive for weight loss (intentional ). Negative for chills and fever.  Respiratory: Negative for cough and shortness of breath.   Cardiovascular: Negative for chest pain and palpitations.     Physical Exam: Vitals:   06/12/20 1325  BP: 128/82  Pulse: 79  Temp: 98.7 F (37.1 C)  TempSrc: Oral  SpO2: 99%  Weight: 245 lb 11.2 oz (111.4 kg)  Height: 5\' 6"  (1.676 m)     General: NAD, nl appearance HEENT: Normocephalic, atraumatic , Conjunctiva nl  Cardiovascular: Normal rate, regular rhythm.  No murmurs, rubs, or gallops Pulmonary : Equal breath sounds, No wheezes, rales, or rhonchi Abdominal: soft, nontender,  bowel sounds present   Assessment & Plan:   See Encounters Tab for problem based charting.  Patient discussed with Dr. 

## 2020-06-14 ENCOUNTER — Encounter: Payer: Self-pay | Admitting: Internal Medicine

## 2020-06-14 NOTE — Assessment & Plan Note (Addendum)
Hypertension: Patient's BP today is 128/82 with a goal of <140/80. Patient takes Amlodipine and started on HCTZ in May of this year. The patient endorses adherence to their medication regimen. He/She denied, chest pain, headache, visual changes, lightheadedness, weakness, dizziness on standing, swelling in the feet or ankles. She has been working out at [employer] a week. Most recent renal function per BMP as below. BMP Latest Ref Rng & Units 03/18/2020 07/24/2019 07/08/2019  Glucose 65 - 99 mg/dL 97 96 694(W)  BUN 6 - 24 mg/dL 12 11 -  Creatinine 5.46 - 1.00 mg/dL 2.70 3.50(K) -  BUN/Creat Ratio 9 - 23 15 10  -  Sodium 134 - 144 mmol/L 138 139 -  Potassium 3.5 - 5.2 mmol/L 4.5 4.9 -  Chloride 96 - 106 mmol/L 102 104 -  CO2 20 - 29 mmol/L 23 23 -  Calcium 8.7 - 10.2 mg/dL 9.0 9.3 -   Medication changes: No  Plan: Continue Amlodipine 10 mg Continue HCTZ 25 mg daily  BMP today  Addendum: Lab let me know patient skipped lab.

## 2020-06-15 NOTE — Progress Notes (Signed)
Internal Medicine Clinic Attending  Case discussed with Dr. Steen at the time of the visit.  We reviewed the resident's history and exam and pertinent patient test results.  I agree with the assessment, diagnosis, and plan of care documented in the resident's note.  Phi Avans, M.D., Ph.D.  

## 2020-08-26 NOTE — Addendum Note (Signed)
Addended by: Gardenia Phlegm on: 08/26/2020 07:34 PM   Modules accepted: Orders

## 2021-01-11 ENCOUNTER — Other Ambulatory Visit: Payer: Self-pay | Admitting: Internal Medicine

## 2021-01-11 DIAGNOSIS — I10 Essential (primary) hypertension: Secondary | ICD-10-CM

## 2021-01-12 NOTE — Telephone Encounter (Signed)
Called pt. Madison Lawrence for the patient to call back.  If no response a letter will be mailed to the patient.

## 2021-01-13 ENCOUNTER — Encounter: Payer: Self-pay | Admitting: Internal Medicine

## 2021-01-13 NOTE — Telephone Encounter (Signed)
Called patient again. N/A a letter was mailed to the pt.

## 2021-01-14 ENCOUNTER — Telehealth: Payer: Self-pay

## 2021-01-14 NOTE — Telephone Encounter (Signed)
Error

## 2021-01-17 NOTE — Assessment & Plan Note (Addendum)
HYPERTENSION FOLLOW-UP: Current medications: amlodipine 10mg  daily, hctz 12.5mg  daily  Assessment: Blood pressure is at goal in the office today. BP Readings from Last 3 Encounters:  01/18/21 125/79  06/12/20 128/82  03/18/20 (!) 144/83   BMP unremarkable.  Plan -continue current management -follow up 6 mo

## 2021-01-17 NOTE — Progress Notes (Signed)
Office Visit   Patient ID: Madison Lawrence, female    DOB: 24-Aug-1971, 50 y.o.   MRN: [id number]  Subjective:  CC: hypertension, prediabetes  Madison Lawrence is a 50 y.o. year old female with hypertension and pre-diabetes who presents for follow up of chronic medical conditions. She has no acute complaints at today's visit. Please refer to problem based charting for details on assessment and plan.      ACTIVE MEDICATIONS   Outpatient Medications Prior to Visit  Medication Sig Dispense Refill  . amLODipine (NORVASC) 10 MG tablet Take 1 tablet (10 mg total) by mouth daily. 90 tablet 1  . hydrochlorothiazide (MICROZIDE) 12.5 MG capsule TAKE 1 CAPSULE(12.5 MG) BY MOUTH DAILY 90 capsule 1  . ondansetron (ZOFRAN) 4 MG tablet Take 1 tablet (4 mg total) by mouth every 6 (six) hours. (Patient not taking: Reported on 06/13/2019) 12 tablet 0   No facility-administered medications prior to visit.     Objective:   BP 125/79 (BP Location: Left Arm, Patient Position: Sitting, Cuff Size: Large)   Pulse 73   Temp 98.2 F (36.8 C) (Oral)   Ht 5\' 6"  (1.676 m)   Wt 240 lb 1.6 oz (108.9 kg)   LMP 12/21/2020   SpO2 100% Comment: room air  BMI 38.75 kg/m  Wt Readings from Last 3 Encounters:  01/18/21 240 lb 1.6 oz (108.9 kg)  06/12/20 245 lb 11.2 oz (111.4 kg)  03/18/20 251 lb 12.8 oz (114.2 kg)   BP Readings from Last 3 Encounters:  01/18/21 125/79  06/12/20 128/82  03/18/20 (!) 144/83   Physical Exam Constitutional:      Appearance: Normal appearance.  Cardiovascular:     Rate and Rhythm: Normal rate and regular rhythm.  Pulmonary:     Effort: Pulmonary effort is normal.     Breath sounds: Normal breath sounds.  Musculoskeletal:     Right lower leg: No edema.     Left lower leg: No edema.     Health Maintenance:   Health Maintenance  Topic Date Due  . TETANUS/TDAP  Never done  . COLONOSCOPY (Pts 45-22yrs Insurance coverage will need to be confirmed)  Never done  . INFLUENZA  VACCINE  05/24/2020  . MAMMOGRAM  06/12/2020  . COVID-19 Vaccine (3 - Booster for Pfizer series) 01/22/2021  . PAP SMEAR-Modifier  08/09/2021  . Hepatitis C Screening  Completed  . HIV Screening  Completed  . HPV VACCINES  Aged Out     Assessment & Plan:   Problem List Items Addressed This Visit      Cardiovascular and Mediastinum   Essential hypertension (Chronic)    HYPERTENSION FOLLOW-UP: Current medications: amlodipine 10mg  daily, hctz 12.5mg  daily  Assessment: Blood pressure is at goal in the office today. BP Readings from Last 3 Encounters:  01/18/21 125/79  06/12/20 128/82  03/18/20 (!) 144/83   BMP unremarkable.  Plan -continue current management -follow up 6 mo       Relevant Medications   amLODipine (NORVASC) 10 MG tablet   hydrochlorothiazide (MICROZIDE) 12.5 MG capsule   Other Relevant Orders   Lipid panel (Completed)   Basic metabolic panel (Completed)     Other   Healthcare maintenance (Chronic)   Prediabetes - Primary (Chronic)    A1C is 5.5 today. Well controlled on diet and exercise. Continue current management.  -repeat A1C in 1Y      Relevant Orders   POC Hbg A1C (Completed)   Hyperlipidemia (Chronic)    10Y  ASCVD Risk = 3.1%. Statin therapy is not indicated at time. Discussed lifestyle modifications at today's visit.  -repeat lipid panel in 2Y      Relevant Medications   amLODipine (NORVASC) 10 MG tablet   hydrochlorothiazide (MICROZIDE) 12.5 MG capsule    Other Visit Diagnoses    Colon cancer screening       Relevant Orders   Ambulatory referral to Gastroenterology   Breast cancer screening by mammogram       Relevant Orders   MM Digital Screening   High risk heterosexual behavior       Relevant Orders   HIV antibody (with reflex) (Completed)   Hepatitis C antibody (Completed)       Return in about 6 months (around 07/21/2021).   Pt discussed with Dr. Phyllis Ginger, MD Internal Medicine Resident PGY-2 [facility] Pager: [phone] 01/20/2021 11:00 AM

## 2021-01-18 ENCOUNTER — Other Ambulatory Visit: Payer: Self-pay

## 2021-01-18 ENCOUNTER — Ambulatory Visit (INDEPENDENT_AMBULATORY_CARE_PROVIDER_SITE_OTHER): Payer: 59 | Admitting: Internal Medicine

## 2021-01-18 ENCOUNTER — Encounter: Payer: Self-pay | Admitting: Internal Medicine

## 2021-01-18 VITALS — BP 125/79 | HR 73 | Temp 98.2°F | Ht 66.0 in | Wt 240.1 lb

## 2021-01-18 DIAGNOSIS — R7303 Prediabetes: Secondary | ICD-10-CM | POA: Diagnosis not present

## 2021-01-18 DIAGNOSIS — I10 Essential (primary) hypertension: Secondary | ICD-10-CM

## 2021-01-18 DIAGNOSIS — E782 Mixed hyperlipidemia: Secondary | ICD-10-CM

## 2021-01-18 DIAGNOSIS — Z7251 High risk heterosexual behavior: Secondary | ICD-10-CM

## 2021-01-18 DIAGNOSIS — E785 Hyperlipidemia, unspecified: Secondary | ICD-10-CM | POA: Diagnosis not present

## 2021-01-18 DIAGNOSIS — Z1231 Encounter for screening mammogram for malignant neoplasm of breast: Secondary | ICD-10-CM

## 2021-01-18 DIAGNOSIS — Z1211 Encounter for screening for malignant neoplasm of colon: Secondary | ICD-10-CM | POA: Diagnosis not present

## 2021-01-18 DIAGNOSIS — Z0000 Encounter for general adult medical examination without abnormal findings: Secondary | ICD-10-CM

## 2021-01-18 LAB — GLUCOSE, CAPILLARY: Glucose-Capillary: 94 mg/dL (ref 70–99)

## 2021-01-18 LAB — POCT GLYCOSYLATED HEMOGLOBIN (HGB A1C): Hemoglobin A1C: 5.5 % (ref 4.0–5.6)

## 2021-01-18 MED ORDER — AMLODIPINE BESYLATE 10 MG PO TABS: 10.0000 mg | ORAL_TABLET | Freq: Every day | ORAL | 1 refills | Status: DC

## 2021-01-18 MED ORDER — HYDROCHLOROTHIAZIDE 12.5 MG PO CAPS: ORAL_CAPSULE | ORAL | 1 refills | Status: DC

## 2021-01-19 DIAGNOSIS — E785 Hyperlipidemia, unspecified: Secondary | ICD-10-CM | POA: Insufficient documentation

## 2021-01-19 LAB — BASIC METABOLIC PANEL
BUN/Creatinine Ratio: 15 (ref 9–23)
BUN: 12 mg/dL (ref 6–24)
CO2: 23 mmol/L (ref 20–29)
Calcium: 9.5 mg/dL (ref 8.7–10.2)
Chloride: 98 mmol/L (ref 96–106)
Creatinine, Ser: 0.81 mg/dL (ref 0.57–1.00)
Glucose: 86 mg/dL (ref 65–99)
Potassium: 4.6 mmol/L (ref 3.5–5.2)
Sodium: 136 mmol/L (ref 134–144)
eGFR: 89 mL/min/{1.73_m2} (ref >59)

## 2021-01-19 LAB — LIPID PANEL
Chol/HDL Ratio: 4.2 ratio (ref 0.0–4.4)
Cholesterol, Total: 223 mg/dL — ABNORMAL HIGH (ref 100–199)
HDL: 53 mg/dL (ref >39)
LDL Chol Calc (NIH): 143 mg/dL — ABNORMAL HIGH (ref 0–99)
Triglycerides: 149 mg/dL (ref 0–149)
VLDL Cholesterol Cal: 27 mg/dL (ref 5–40)

## 2021-01-19 LAB — HEPATITIS C ANTIBODY: Hep C Virus Ab: <0.1 s/co ratio (ref 0.0–0.9)

## 2021-01-19 LAB — HIV ANTIBODY (ROUTINE TESTING W REFLEX): HIV Screen 4th Generation wRfx: NONREACTIVE

## 2021-01-19 NOTE — Assessment & Plan Note (Signed)
A1C is 5.5 today. Well controlled on diet and exercise. Continue current management.  -repeat A1C in 1Y

## 2021-01-19 NOTE — Assessment & Plan Note (Addendum)
10Y ASCVD Risk = 3.1%. Statin therapy is not indicated at time. Discussed lifestyle modifications at today's visit.  -repeat lipid panel in 2Y

## 2021-01-20 ENCOUNTER — Encounter: Payer: Self-pay | Admitting: Internal Medicine

## 2021-01-23 NOTE — Progress Notes (Signed)
Internal Medicine Clinic Attending  Case discussed with Dr. Christian  At the time of the visit.  We reviewed the resident's history and exam and pertinent patient test results.  I agree with the assessment, diagnosis, and plan of care documented in the resident's note.  

## 2021-06-23 ENCOUNTER — Ambulatory Visit: Payer: 59

## 2021-07-06 ENCOUNTER — Other Ambulatory Visit: Payer: Self-pay

## 2021-07-06 ENCOUNTER — Ambulatory Visit
Admission: RE | Admit: 2021-07-06 | Discharge: 2021-07-06 | Disposition: A | Discharge status: Home / Self Care | Payer: 59 | Source: Ambulatory Visit | Attending: Internal Medicine | Admitting: Internal Medicine

## 2021-07-06 DIAGNOSIS — Z1231 Encounter for screening mammogram for malignant neoplasm of breast: Secondary | ICD-10-CM

## 2021-07-13 ENCOUNTER — Other Ambulatory Visit: Payer: Self-pay

## 2021-07-13 DIAGNOSIS — I10 Essential (primary) hypertension: Secondary | ICD-10-CM

## 2021-07-13 MED ORDER — AMLODIPINE BESYLATE 10 MG PO TABS
10.0000 mg | ORAL_TABLET | Freq: Every day | ORAL | 0 refills | Status: DC
Start: 2021-07-13 — End: 2021-08-10

## 2021-07-13 NOTE — Telephone Encounter (Signed)
Please schedule her a routine visit with me this month. Thank you

## 2021-08-10 ENCOUNTER — Other Ambulatory Visit: Payer: Self-pay

## 2021-08-10 DIAGNOSIS — I10 Essential (primary) hypertension: Secondary | ICD-10-CM

## 2021-08-11 MED ORDER — AMLODIPINE BESYLATE 10 MG PO TABS: 10.0000 mg | ORAL_TABLET | Freq: Every day | ORAL | 0 refills | Status: DC

## 2021-09-01 ENCOUNTER — Encounter: Payer: 59 | Admitting: Internal Medicine

## 2021-09-08 ENCOUNTER — Other Ambulatory Visit: Payer: Self-pay

## 2021-09-08 DIAGNOSIS — I10 Essential (primary) hypertension: Secondary | ICD-10-CM

## 2021-09-08 MED ORDER — AMLODIPINE BESYLATE 10 MG PO TABS: 10.0000 mg | ORAL_TABLET | Freq: Every day | ORAL | 0 refills | Status: DC

## 2021-09-08 MED ORDER — HYDROCHLOROTHIAZIDE 12.5 MG PO CAPS: ORAL_CAPSULE | ORAL | 1 refills | Status: DC

## 2021-09-14 ENCOUNTER — Telehealth: Payer: Self-pay

## 2021-09-14 NOTE — Telephone Encounter (Signed)
Patient returned call. Notified her that amlodipine was sent to [facility] on 09/08/21. She confirmed upcoming appt on 12/6 with PCP.

## 2021-09-14 NOTE — Telephone Encounter (Signed)
Amlodipine was refilled 09/08/21.  Called pt to inform her of this, no answer - left message to call the office.

## 2021-09-14 NOTE — Telephone Encounter (Signed)
amLODipine (NORVASC) 10 MG tablet, REFILL REQUEST @ [id number] - [place], [place] - [address] AT [facility] [address]

## 2021-09-28 ENCOUNTER — Other Ambulatory Visit: Payer: Self-pay

## 2021-09-28 ENCOUNTER — Encounter: Payer: Self-pay | Admitting: Internal Medicine

## 2021-09-28 ENCOUNTER — Ambulatory Visit (INDEPENDENT_AMBULATORY_CARE_PROVIDER_SITE_OTHER): Payer: 59 | Admitting: Internal Medicine

## 2021-09-28 VITALS — BP 123/87 | HR 76 | Temp 99.1°F | Ht 67.0 in | Wt 242.3 lb

## 2021-09-28 DIAGNOSIS — E669 Obesity, unspecified: Secondary | ICD-10-CM

## 2021-09-28 DIAGNOSIS — R7303 Prediabetes: Secondary | ICD-10-CM | POA: Diagnosis not present

## 2021-09-28 DIAGNOSIS — I10 Essential (primary) hypertension: Secondary | ICD-10-CM

## 2021-09-28 DIAGNOSIS — Z0000 Encounter for general adult medical examination without abnormal findings: Secondary | ICD-10-CM

## 2021-09-28 DIAGNOSIS — Z23 Encounter for immunization: Secondary | ICD-10-CM | POA: Diagnosis not present

## 2021-09-28 DIAGNOSIS — E66812 Obesity, class 2: Secondary | ICD-10-CM

## 2021-09-28 LAB — POCT GLYCOSYLATED HEMOGLOBIN (HGB A1C): Hemoglobin A1C: 5.8 % — AB (ref 4.0–5.6)

## 2021-09-28 LAB — GLUCOSE, CAPILLARY: Glucose-Capillary: 96 mg/dL (ref 70–99)

## 2021-09-28 MED ORDER — HYDROCHLOROTHIAZIDE 12.5 MG PO CAPS: ORAL_CAPSULE | ORAL | 1 refills | Status: DC

## 2021-09-28 MED ORDER — AMLODIPINE BESYLATE 10 MG PO TABS: 10.0000 mg | ORAL_TABLET | Freq: Every day | ORAL | 1 refills | Status: DC

## 2021-09-28 NOTE — Progress Notes (Addendum)
Office Visit   Patient ID: Madison Lawrence, female    DOB: [birth year], 50 y.o.   MRN: [id number]   PCP: Elige Radon, MD   Subjective:  CC: Follow-up (FOLLOW UP / MEDICATION REFILL / ), Prediabetes, Hypertension, and Obesity   Madison Lawrence is a 50 y.o. year old female who presents for the above medical conditions. Please refer to problem based charting for assessment and plan.  Objective:   BP 123/87 (BP Location: Left Arm, Patient Position: Standing, Cuff Size: Normal)   Pulse 76   Temp 99.1 F (37.3 C) (Oral)   Ht 5\' 7"  (1.702 m)   Wt 242 lb 4.8 oz (109.9 kg)   SpO2 100%   BMI 37.95 kg/m  BP Readings from Last 3 Encounters:  09/28/21 123/87  01/18/21 125/79  06/12/20 128/82   General: Well-appearing female in no distress Cardiac: Regular rate and rhythm Pulm: Lungs clear throughout Skin: No rash or lesion on limited exam  Assessment & Plan:   Problem List Items Addressed This Visit       Cardiovascular and Mediastinum   Essential hypertension - Primary (Chronic)    HYPERTENSION FOLLOW-UP: Current medications: Amlodipine 10 mg daily, hydrochlorothiazide 12.5 mg daily Med Adherence: [x]  Yes    []  No Medication side effects: []  Yes    [x]  No Adherence with salt restriction: [x]  Yes    []  No  Assessment: Blood pressure is at goal in the office today. BP Readings from Last 3 Encounters:  09/28/21 123/87  01/18/21 125/79  06/12/20 128/82    Plan -BMP -continue current management -follow up 6 mo       Relevant Medications   amLODipine (NORVASC) 10 MG tablet   hydrochlorothiazide (MICROZIDE) 12.5 MG capsule   Other Relevant Orders   Basic metabolic panel (Completed)     Other   Healthcare maintenance (Chronic)    She reports having a colonoscopy fairly recently however was unable to recall the name of the location where she had it done.  She is going to look into it and requests that those results be forwarded to our office. Pneumonia and Tdap vaccination  provided today.      Class 2 obesity (Chronic)    Today's visit primarily focused around obesity management.  She notes that she has struggled with this for several years however is noticed that it is becoming increasingly more difficult to lose weight has she is gotten older.  She notes that she is tried phentermine in the past which worked for her.  Over the past month, she has become more conscious of her dietary habits and has started working out. Assessment: Discussed the multifactorial nature of obesity that requires management from several different angles.  Presented options including referral to healthy weight and wellness versus GLP-1 agonist versus surgery versus conservative management. I do think that she would benefit from a referral to healthy weight and wellness for a detailed evaluation.  Although there is not a contraindication to phentermine, I would consider this a little less favorable given her known hypertension. Plan Start trulicity as noted below Check TSH      Relevant Orders   TSH (Completed)   Prediabetes (Chronic)    A1C 5.8. Lifestyle management has been ineffective. Discussed risk for progression to diabetes and long term complications associated.  She denies known personal or family history of thyroid malignancy or MEN gene mutations. Discussed GLP-1 agonists which she is interested in trying. Reviewed most common adverse effects. -Start Trulicity  0.75mg  x4w, then 1.5mg  weekly thereafter -Referred to Boykin Reaper, Pharm D., for medication education -F/u with me around March 2023      Relevant Orders   POC Hbg A1C (Completed)   Glucose, capillary (Completed)   Amb Referral to Clinical Pharmacist   Other Visit Diagnoses     Immunization due       Relevant Orders   Tdap vaccine greater than or equal to 7yo IM (Completed)   Pneumococcal conjugate vaccine 20-valent (Prevnar 20) (Completed)        Return in about 3 months (around 12/27/2021) for Weight loss  follow up.   Pt discussed with Dr. Leo Rod, MD Internal Medicine Resident PGY-3 [facility] 10/07/2021 3:56 PM

## 2021-09-28 NOTE — Patient Instructions (Signed)
Madison Lawrence, it was a pleasure seeing you today!  Today we discussed:  Weight Loss. It sounds like you are working hard, so keep it up!! -I will call you with the results of your labs -Please let me know if you decide that you would like a referral to the [facility].   Hypertension. Your blood pressure looks good in the office today.   Medical Records. Please have your colonoscopy results faxed to our office at your earliest convenience.   Follow-up: 3 months for weight loss follow up   Please make sure to arrive 15 minutes prior to your next appointment. If you arrive late, you may be asked to reschedule.   We look forward to seeing you next time. Please call our clinic at [phone] if you have any questions or concerns. The best time to call is Monday-Friday from 9am-4pm, but there is someone available 24/7. If after hours or the weekend, call the main hospital number and ask for the Internal Medicine Resident On-Call. If you need medication refills, please notify your pharmacy one week in advance and they will send [place] a request.  Thank you for letting us take part in your care. Wishing you the best!

## 2021-09-28 NOTE — Assessment & Plan Note (Addendum)
A1C 5.8. Lifestyle management has been ineffective. Discussed risk for progression to diabetes and long term complications associated.  She denies known personal or family history of thyroid malignancy or MEN gene mutations. Discussed GLP-1 agonists which she is interested in trying. Reviewed most common adverse effects. -Start Trulicity 0.75mg  x4w, then 1.5mg  weekly thereafter -Referred to Arbutus Ped D., for medication education -F/u with me around March 2023

## 2021-09-28 NOTE — Assessment & Plan Note (Addendum)
Today's visit primarily focused around obesity management.  She notes that she has struggled with this for several years however is noticed that it is becoming increasingly more difficult to lose weight has she is gotten older.  She notes that she is tried phentermine in the past which worked for her.  Over the past month, she has become more conscious of her dietary habits and has started working out. Assessment: Discussed the multifactorial nature of obesity that requires management from several different angles.  Presented options including referral to healthy weight and wellness versus GLP-1 agonist versus surgery versus conservative management. I do think that she would benefit from a referral to healthy weight and wellness for a detailed evaluation.  Although there is not a contraindication to phentermine, I would consider this a little less favorable given her known hypertension. Plan  Start trulicity as noted below  Check TSH

## 2021-09-28 NOTE — Assessment & Plan Note (Signed)
She reports having a colonoscopy fairly recently however was unable to recall the name of the location where she had it done.  She is going to look into it and requests that those results be forwarded to our office. Pneumonia and Tdap vaccination provided today.

## 2021-09-28 NOTE — Assessment & Plan Note (Addendum)
HYPERTENSION FOLLOW-UP: Current medications: Amlodipine 10 mg daily, hydrochlorothiazide 12.5 mg daily Med Adherence: [x]  Yes    []  No Medication side effects: []  Yes    [x]  No Adherence with salt restriction: [x]  Yes    []  No  Assessment: Blood pressure is at goal in the office today. BP Readings from Last 3 Encounters:  09/28/21 123/87  01/18/21 125/79  06/12/20 128/82    Plan -BMP -continue current management -follow up 6 mo

## 2021-09-29 LAB — BASIC METABOLIC PANEL
BUN/Creatinine Ratio: 11 (ref 9–23)
BUN: 9 mg/dL (ref 6–24)
CO2: 21 mmol/L (ref 20–29)
Calcium: 9.3 mg/dL (ref 8.7–10.2)
Chloride: 102 mmol/L (ref 96–106)
Creatinine, Ser: 0.79 mg/dL (ref 0.57–1.00)
Glucose: 87 mg/dL (ref 70–99)
Potassium: 4.1 mmol/L (ref 3.5–5.2)
Sodium: 138 mmol/L (ref 134–144)
eGFR: 92 mL/min/{1.73_m2} (ref >59)

## 2021-09-29 LAB — TSH: TSH: 2.81 u[IU]/mL (ref 0.450–4.500)

## 2021-09-29 MED ORDER — TRULICITY 0.75 MG/0.5ML ~~LOC~~ SOAJ: 0.7500 mg | SUBCUTANEOUS | 0 refills | Status: DC

## 2021-09-29 MED ORDER — TRULICITY 1.5 MG/0.5ML ~~LOC~~ SOAJ: 1.5000 mg | SUBCUTANEOUS | 1 refills | Status: DC

## 2021-09-29 NOTE — Addendum Note (Signed)
Addended by: Elige Radon on: 09/29/2021 06:26 PM   Modules accepted: Orders

## 2021-09-30 ENCOUNTER — Telehealth: Payer: Self-pay

## 2021-09-30 DIAGNOSIS — R7303 Prediabetes: Secondary | ICD-10-CM

## 2021-09-30 NOTE — Telephone Encounter (Signed)
PA  for pt ( TRULICITY 0.75MG  /0.5ML SDP 0.5ML) came through on cover my meds was done and submitted with office notes from 12/6.Marland Kitchen Awaiting approval or denial

## 2021-10-04 ENCOUNTER — Other Ambulatory Visit (HOSPITAL_COMMUNITY): Payer: Self-pay

## 2021-10-04 NOTE — Telephone Encounter (Signed)
DECISION :    The request for coverage for TRULICITY INJ 0.75/0.5, use as directed (2 ml per 28 days), is denied.  This decision is based on health plan criteria for TRULICITY INJ 0.75/0.5. This medicine is covered only if:  All of the following:  (1) You have a diagnosis of type 2 diabetes mellitus confirmed by accepted laboratory testing methodologies per treatment guidelines (for example, A1C greater than or equal to 6.5%, fasting plasma glucose greater than or equal to 126mg /dL and/or 2-hour plasma glucose greater than or equal to 200mg /dL). (2) You have a history of suboptimal response, contraindication or intolerance to metformin (generic Glucophage, Glucophage XR). The information provided does not show that you meet the criteria listed above. Reviewed by: , Rph   ( PT IS NOT DIABETIC ACCORDING TO PROBLEM LIST AND CHART NOTES , MEDICATION WILL NOT BE COVERED )

## 2021-10-05 MED ORDER — METFORMIN HCL 500 MG PO TABS: ORAL_TABLET | ORAL | 0 refills | Status: DC

## 2021-10-05 NOTE — Telephone Encounter (Signed)
Discussed Trulicity prior auth denial with [employer] and offered metformin trial which she is agreeable to. Adverse effects and titration discussed.  -Will start with metformin 500mg  daily for 2 weeks, followed by 500mg  twice daily there after.  -She will continue to work on lifestyle changes as well -Follow up with me in January

## 2021-10-05 NOTE — Assessment & Plan Note (Signed)
09/2021--prior auth for Trulicity declined. Needs step therapy. Metformin started.

## 2021-10-11 ENCOUNTER — Telehealth: Payer: Self-pay

## 2021-10-11 NOTE — Telephone Encounter (Signed)
Requesting to speak with a nurse about metFORMIN (GLUCOPHAGE) 500 MG tablet. Please call back.

## 2021-10-11 NOTE — Progress Notes (Signed)
Internal Medicine Clinic Attending  Case discussed with Dr. Christian  At the time of the visit.  We reviewed the resident's history and exam and pertinent patient test results.  I agree with the assessment, diagnosis, and plan of care documented in the resident's note.  

## 2021-10-11 NOTE — Telephone Encounter (Signed)
Return pt's call - no answer;left message on vm to call the office if she need to speak to triage.

## 2021-10-13 NOTE — Telephone Encounter (Signed)
Pt called and informed of Dr Luciana Axe  response. Stated she will let us know if her symptoms do not subside.

## 2021-10-13 NOTE — Telephone Encounter (Signed)
Call from pt - stated she was started on Metformin and was told by the doctor if she develops an upset stomach to call the office. Pt stated no abd pain but having an extra bowel movement (stated she's usually regular) during the day and 1 episode of diarrhea.

## 2021-10-28 ENCOUNTER — Encounter: Payer: 59 | Admitting: Pharmacist

## 2021-11-09 ENCOUNTER — Other Ambulatory Visit: Payer: Self-pay

## 2021-11-10 ENCOUNTER — Other Ambulatory Visit: Payer: Self-pay | Admitting: Internal Medicine

## 2021-11-10 MED ORDER — METFORMIN HCL 500 MG PO TABS: 500.0000 mg | ORAL_TABLET | Freq: Two times a day (BID) | ORAL | 0 refills | Status: DC

## 2021-11-10 MED ORDER — METFORMIN HCL 500 MG PO TABS: ORAL_TABLET | ORAL | 0 refills | Status: DC

## 2021-11-18 ENCOUNTER — Ambulatory Visit (INDEPENDENT_AMBULATORY_CARE_PROVIDER_SITE_OTHER): Payer: 59 | Admitting: Pharmacist

## 2021-11-18 DIAGNOSIS — E669 Obesity, unspecified: Secondary | ICD-10-CM | POA: Diagnosis not present

## 2021-11-18 MED ORDER — OZEMPIC (0.25 OR 0.5 MG/DOSE) 2 MG/1.5ML ~~LOC~~ SOPN: 0.2500 mg | PEN_INJECTOR | SUBCUTANEOUS | 0 refills | Status: DC

## 2021-11-18 NOTE — Progress Notes (Signed)
° °  Subjective:    Patient ID: Madison Lawrence, female    DOB: 07-09-1971, 51 y.o.   MRN: [id number]  HPI Patient is a 51 y.o. female who presents for first weight management follow up. She is in good spirits and presents without assistance.  She initiated use of metformin one month ago. Reports any undesirable side effects of constant diarrhea despite only taking metformin $RemoveBeforeDE'500mg'CqCmhRrfcfzKWOA$  once daily.   Current meal patterns: Smaller portions Breakfast: oatmeal w/ raisins or banana; raisin bread; [place] bacon or sausage  Lunch: salad, applesauce Supper: baked chicken; soup Snacks: sesame sticks, crackers, fruit, cheese Drinks: hot tea, coffee, water, juice  Current exercise: walking  Previously tried weight management pharmacotherapy: phentermine Current weight management pharmacotherapy: metformin  Patient states that She Is/is not: is taking his/her/their: her diabetes medications as prescribed. Patient reports adherence with medications.  Do you feel that your medications are working for you?  yes  Have you been experiencing any side effects to the medications prescribed? yes  Do you have any problems obtaining medications due to transportation or finances?  no     Objective:   Vitals with BMI 09/28/2021 01/18/2021 06/12/2020  Height $Remov'5\' 7"'ugYhlY$  $Remove'5\' 6"'TEVVeCd$  $RemoveB'5\' 6"'kYYLaqFx$   Weight 242 lbs 5 oz 240 lbs 2 oz 245 lbs 11 oz  BMI 37.94 29.47 65.46  Systolic 503 546 568  Diastolic 87 79 82  Pulse 76 73 79     Lab Results  Component Value Date   HGBA1C 5.8 (A) 09/28/2021   HGBA1C 5.5 01/18/2021   HGBA1C 5.7 (A) 03/18/2020    Assessment/Plan:    Patient has not met goal of at least 5% of body weight loss with comprehensive lifestyle modifications alone in the past 3-6 months. Pharmacotherapy is appropriate to pursue as augmentation. Patient educated on purpose, proper use and potential adverse effects of Ozempic.  Following instruction patient verbalized understanding of treatment plan.    Start Ozempic 0.$RemoveBeforeD'25mg'XTylfbhDQRyNEu$  once  weekly Stop metformin $RemoveBefore'500mg'iMXLJOXgsacQQ$  once daily Over the next 180 days, patient will work with PharmD and primary care provider to work towards 5-10% body weight loss Comprehensive medication review performed, medication list updated in electronic medical record Patient will adhere to dietary modifications Patient will exercise 30 minutes 2x/week with goal to increase towards target of at least 150 minutes of moderate intensity exercise weekly Patient will report any questions or concerns to provider

## 2021-11-18 NOTE — Patient Instructions (Signed)
Madison Lawrence it was a pleasure seeing you today!  Today we discussed a plan for weight management to work towards your goal of 170-180 lbs  START Ozempic 0.25mg  once weekly and stop metformin Exercise goal: work towards a total of 150 minutes a week (30 minutes 5 days a week) Diet goal: work towards decreasing your carbohydrates and sugary foods/drink  Follow-up with Dr. Ephriam Knuckles  If you have any questions or concerns before your next appointment please call the clinic

## 2022-01-22 NOTE — Progress Notes (Signed)
? ?  Office Visit ? ? Patient ID: Madison Lawrence, female    DOB: 1970-12-13, 51 y.o.   MRN: [id number]   PCP: Mitzi Hansen, MD  ? ?Subjective:  ? ?Madison Lawrence is a 51 y.o. year old female who presents for hypertension and obesity follow up.  ? ?LOV with me was 09/2021. blood pressure was at goal so we continued on amlodipine and hctz. She was started on trulicity for comanagement of prediabetes and obesity.  ?  ?Interm hx: Unfortunately, insurance denied authorization for trulicity. I had called and spoke with her about this and we decided to trial metformin. She met with Madison Lawrence in January who discontinued metformin and started her on ozempic.  ? ?Today's visit: ?She notes that her insurance did not cover ozempic. She would still like to lose weight.  ?Denies any acute issues at today's visit.  ? ? ?ACTIVE MEDICATIONS  ? ?Outpatient Medications Prior to Visit  ?Medication Sig  ? amLODipine (NORVASC) 10 MG tablet Take 1 tablet (10 mg total) by mouth daily.  ? hydrochlorothiazide (MICROZIDE) 12.5 MG capsule TAKE 1 CAPSULE(12.5 MG) BY MOUTH DAILY  ?  ? ?Objective:  ? ?BP 123/63 (BP Location: Left Arm, Patient Position: Sitting, Cuff Size: Large)   Pulse 68   Temp 99.1 ?F (37.3 ?C) (Oral)   Ht 5' 6.5" (1.689 m)   Wt 243 lb (110.2 kg)   LMP 10/10/2021 (Exact Date)   SpO2 100%   BMI 38.63 kg/m?  ?Wt Readings from Last 3 Encounters:  ?01/25/22 243 lb (110.2 kg)  ?09/28/21 242 lb 4.8 oz (109.9 kg)  ?01/18/21 240 lb 1.6 oz (108.9 kg)  ?  ?BP Readings from Last 3 Encounters:  ?01/25/22 123/63  ?09/28/21 123/87  ?01/18/21 125/79  ? ?General: well appearing, no distress ?Cardiac: RRR ?Pulm: lungs clear throughout. ? ?Assessment & Plan:  ? ?Problem List Items Addressed This Visit   ? ?  ? Cardiovascular and Mediastinum  ? Essential hypertension (Chronic)  ?  HYPERTENSION FOLLOW-UP: ?Current medications: Amlodipine 10 mg daily, hydrochlorothiazide 12.5 mg daily ?Assessment: Blood pressure is at goal in the office today. ?BP  Readings from Last 3 Encounters:  ?01/25/22 123/63  ?09/28/21 123/87  ?01/18/21 125/79  ?  ?Plan ?-continue current management ?-follow up 6 mo ? ?  ?  ?  ? Other  ? Class 2 obesity - Primary (Chronic)  ?  Discussed weight loss again at today's visit. She is still highly motivated.  ?Referred to healthy weight and wellness ?  ?  ? Relevant Orders  ? Amb Ref to Medical Weight Management  ? Prediabetes (Chronic)  ? ? ? ?Return in about 6 months (around 07/27/2022) for Blood pressure follow up. ? ? ?Pt discussed with Dr. Jimmye Norman ? ?Mitzi Hansen, MD ?Internal Medicine Resident PGY-3 ?[facility] ?01/26/2022 9:33 AM  ?  ?

## 2022-01-25 ENCOUNTER — Other Ambulatory Visit: Payer: Self-pay | Admitting: Internal Medicine

## 2022-01-25 ENCOUNTER — Other Ambulatory Visit: Payer: Self-pay

## 2022-01-25 ENCOUNTER — Encounter: Payer: Self-pay | Admitting: Internal Medicine

## 2022-01-25 ENCOUNTER — Ambulatory Visit (INDEPENDENT_AMBULATORY_CARE_PROVIDER_SITE_OTHER): Payer: 59 | Admitting: Internal Medicine

## 2022-01-25 VITALS — BP 123/63 | HR 68 | Temp 99.1°F | Ht 66.5 in | Wt 243.0 lb

## 2022-01-25 DIAGNOSIS — E669 Obesity, unspecified: Secondary | ICD-10-CM | POA: Diagnosis not present

## 2022-01-25 DIAGNOSIS — I10 Essential (primary) hypertension: Secondary | ICD-10-CM

## 2022-01-25 DIAGNOSIS — R7303 Prediabetes: Secondary | ICD-10-CM

## 2022-01-26 ENCOUNTER — Encounter: Payer: Self-pay | Admitting: Internal Medicine

## 2022-01-26 NOTE — Assessment & Plan Note (Signed)
HYPERTENSION FOLLOW-UP: ?Current medications: Amlodipine 10 mg daily, hydrochlorothiazide 12.5 mg daily ?Assessment: Blood pressure is at goal in the office today. ?BP Readings from Last 3 Encounters:  ?01/25/22 123/63  ?09/28/21 123/87  ?01/18/21 125/79  ?  ?Plan ?-continue current management ?-follow up 6 mo ? ?

## 2022-01-26 NOTE — Progress Notes (Signed)
Internal Medicine Clinic Attending  Case discussed with Dr. Christian  At the time of the visit.  We reviewed the resident's history and exam and pertinent patient test results.  I agree with the assessment, diagnosis, and plan of care documented in the resident's note.  

## 2022-01-26 NOTE — Assessment & Plan Note (Addendum)
Discussed weight loss again at today's visit. She is still highly motivated. No significant weight changes from her last office visit.  ?? Referred to healthy weight and wellness ?

## 2022-04-14 ENCOUNTER — Other Ambulatory Visit: Payer: Self-pay | Admitting: *Deleted

## 2022-04-14 DIAGNOSIS — I10 Essential (primary) hypertension: Secondary | ICD-10-CM

## 2022-04-14 MED ORDER — AMLODIPINE BESYLATE 10 MG PO TABS: 10.0000 mg | ORAL_TABLET | Freq: Every day | ORAL | 1 refills | Status: DC

## 2022-04-18 ENCOUNTER — Encounter: Payer: Self-pay | Admitting: *Deleted

## 2022-06-02 ENCOUNTER — Other Ambulatory Visit: Payer: Self-pay | Admitting: Student

## 2022-06-05 NOTE — Progress Notes (Signed)
   CC: Breast Pain   HPI:   [name] is a 51 y.o. female with a past medical history of obesity, hypertension, hyperlipidemia, and prediabetes who presents for left breast pain.  Patient last seen at [facility] on January 25, 2022.     Past Medical History:  Diagnosis Date   Hearing loss      Review of Systems:    Reports left breast discomfort Denies fever, weight loss, redness, swelling, discharge, skin changes    Physical Exam:  Vitals:   06/06/22 0841  BP: 122/81  Pulse: 69  Temp: 98.1 F (36.7 C)  TempSrc: Oral  SpO2: 100%  Weight: 236 lb 1.6 oz (107.1 kg)  Height: 5\' 7"  (1.702 m)    General:   awake and alert, sitting comfortably in chair, cooperative, not in acute distress Skin:   warm and dry, intact without any obvious lesions or scars, no rashes or lesions  Lungs:   normal respiratory effort, breathing unlabored, symmetrical chest rise, no crackles or wheezing Cardiac:   regular rate and rhythm, normal S1 and S2 Breast:   left breast slightly larger than right, tissue density similar in both breasts, negative for lumps, ulcers, skin changes, skin distortion, nipple eczema, retraction, and discharge, no axillary or cervical lymphadenopathy Psychiatric:   mood and affect normal, intelligible speech    Assessment & Plan:   Essential hypertension Has been well controlled on amlodipine and hydrochlorothiazide.  Today in clinic her blood pressure was 122/81.  -Continue current regimen of amlodipine and hydrochlorothiazide   Prediabetes Patient was identified as prediabetic with an A1c of 5.8 in December 2022.   -She has a visit at [facility] scheduled for October 2, check A1c at that time   Class 2 obesity Weight at her last visit in April was 243 pounds, now it is 236 pounds.   -Encourage weight loss through reduced caloric intake and increase in exercise frequency   Breast pain, left Patient started to notice pain in her left breast a couple of weeks ago.   Usually has some abdominal soreness during her menstrual cycle, but this is different.  Describes the pain as achy and it is accompanied by a sensation of tightness.  It is not sharp, dull, or burning. Discomfort comes and goes, it is worse at night but never wakes her up.  He has not noticed any redness, discharge, skin changes, or lumps.  He continues to have regular menstrual cycles and skips only 1 month this year.  Her levels of emotional stress are normal, and she has not had any recent medication changes.  Last mammogram performed in September 2022 was unremarkable. Exam was negative for lumps, ulcers, skin changes, skin distortion, nipple eczema, retraction, and discharge.  There was no axillary or cervical lymphadenopathy. She denies any recent weight loss. Tissue density was the same in both breasts.  Mild tenderness to palpation in the center of her left breast.  -Order diagnostic mammography bilaterally and follow-up for scheduled next visit on 10-2     See Encounters Tab for problem based charting.  Patient seen with Dr. October 2022

## 2022-06-06 ENCOUNTER — Encounter: Payer: Self-pay | Admitting: Student

## 2022-06-06 ENCOUNTER — Ambulatory Visit (INDEPENDENT_AMBULATORY_CARE_PROVIDER_SITE_OTHER): Payer: 59 | Admitting: Student

## 2022-06-06 ENCOUNTER — Other Ambulatory Visit: Payer: Self-pay

## 2022-06-06 VITALS — BP 122/81 | HR 69 | Temp 98.1°F | Ht 67.0 in | Wt 236.1 lb

## 2022-06-06 DIAGNOSIS — I10 Essential (primary) hypertension: Secondary | ICD-10-CM | POA: Diagnosis not present

## 2022-06-06 DIAGNOSIS — N644 Mastodynia: Secondary | ICD-10-CM | POA: Diagnosis not present

## 2022-06-06 DIAGNOSIS — E669 Obesity, unspecified: Secondary | ICD-10-CM

## 2022-06-06 DIAGNOSIS — R7303 Prediabetes: Secondary | ICD-10-CM | POA: Diagnosis not present

## 2022-06-06 DIAGNOSIS — Z6836 Body mass index (BMI) 36.0-36.9, adult: Secondary | ICD-10-CM

## 2022-06-06 NOTE — Assessment & Plan Note (Signed)
Patient was identified as prediabetic with an A1c of 5.8 in December 2022.   -She has a visit at [facility] scheduled for October 2, check A1c at that time

## 2022-06-06 NOTE — Assessment & Plan Note (Signed)
Has been well controlled on amlodipine and hydrochlorothiazide.  Today in clinic her blood pressure was 122/81.  -Continue current regimen of amlodipine and hydrochlorothiazide

## 2022-06-06 NOTE — Patient Instructions (Addendum)
  Thank you, Ms.Yevonne Aline, for allowing [place] to provide your care today. Today we discussed . . .  > Breast Pain       - your breast pain is likely benign, but we have ordered a bilateral diagnostic mammography for a final diagnosis > Hypertension       - your blood pressure today was good, we will discuss this more at your next visit in two months > Prediabetes       - your last A1C was slightly elevated, we will discuss this more at your next visit in two months   I have ordered the following labs for you:  Lab Orders  No laboratory test(s) ordered today      Tests ordered today:  Bilateral Diagnostic Mammography   Referrals ordered today:   Referral Orders  No referral(s) requested today      I have ordered the following medication/changed the following medications:   Stop the following medications: There are no discontinued medications.   Start the following medications: No orders of the defined types were placed in this encounter.     Follow up: 2 months    Remember:  Please come in for your next appointment with Dr Geraldo Pitter scheduled for 10-2. Have your mammography completed before then so that we know the results. See you in October!   Should you have any questions or concerns please call the internal medicine clinic at [phone].     Lajuana Ripple, MD [facility]

## 2022-06-06 NOTE — Assessment & Plan Note (Signed)
Patient started to notice pain in her left breast a couple of weeks ago.  Usually has some abdominal soreness during her menstrual cycle, but this is different.  Describes the pain as achy and it is accompanied by a sensation of tightness.  It is not sharp, dull, or burning. Discomfort comes and goes, it is worse at night but never wakes her up.  He has not noticed any redness, discharge, skin changes, or lumps.  He continues to have regular menstrual cycles and skips only 1 month this year.  Her levels of emotional stress are normal, and she has not had any recent medication changes.  Last mammogram performed in September 2022 was unremarkable. Exam was negative for lumps, ulcers, skin changes, skin distortion, nipple eczema, retraction, and discharge.  There was no axillary or cervical lymphadenopathy. She denies any recent weight loss. Tissue density was the same in both breasts.  Mild tenderness to palpation in the center of her left breast.  -Order diagnostic mammography bilaterally and follow-up for scheduled next visit on 10-2

## 2022-06-06 NOTE — Assessment & Plan Note (Signed)
Weight at her last visit in April was 243 pounds, now it is 236 pounds.   -Encourage weight loss through reduced caloric intake and increase in exercise frequency

## 2022-06-07 NOTE — Progress Notes (Signed)
Internal Medicine Clinic Attending  I saw and evaluated the patient.  I personally confirmed the key portions of the history and exam documented by Dr. Harper and I reviewed pertinent patient test results.  The assessment, diagnosis, and plan were formulated together and I agree with the documentation in the resident's note.  

## 2022-06-09 ENCOUNTER — Other Ambulatory Visit: Payer: Self-pay | Admitting: Student

## 2022-06-09 DIAGNOSIS — N644 Mastodynia: Secondary | ICD-10-CM

## 2022-07-07 ENCOUNTER — Ambulatory Visit: Payer: 59

## 2022-07-07 ENCOUNTER — Ambulatory Visit
Admission: RE | Admit: 2022-07-07 | Discharge: 2022-07-07 | Disposition: A | Discharge status: Home / Self Care | Payer: 59 | Source: Ambulatory Visit | Attending: Internal Medicine | Admitting: Internal Medicine

## 2022-07-07 DIAGNOSIS — N644 Mastodynia: Secondary | ICD-10-CM

## 2022-07-25 ENCOUNTER — Encounter: Payer: Self-pay | Admitting: Student

## 2022-07-25 ENCOUNTER — Ambulatory Visit (INDEPENDENT_AMBULATORY_CARE_PROVIDER_SITE_OTHER): Payer: 59 | Admitting: Student

## 2022-07-25 VITALS — BP 138/76 | HR 67 | Temp 98.5°F | Ht 67.0 in | Wt 241.3 lb

## 2022-07-25 DIAGNOSIS — N644 Mastodynia: Secondary | ICD-10-CM

## 2022-07-25 DIAGNOSIS — R7303 Prediabetes: Secondary | ICD-10-CM | POA: Diagnosis not present

## 2022-07-25 DIAGNOSIS — E785 Hyperlipidemia, unspecified: Secondary | ICD-10-CM | POA: Diagnosis not present

## 2022-07-25 DIAGNOSIS — I10 Essential (primary) hypertension: Secondary | ICD-10-CM

## 2022-07-25 DIAGNOSIS — E669 Obesity, unspecified: Secondary | ICD-10-CM

## 2022-07-25 DIAGNOSIS — Z0000 Encounter for general adult medical examination without abnormal findings: Secondary | ICD-10-CM

## 2022-07-25 DIAGNOSIS — Z6837 Body mass index (BMI) 37.0-37.9, adult: Secondary | ICD-10-CM

## 2022-07-25 LAB — POCT GLYCOSYLATED HEMOGLOBIN (HGB A1C): Hemoglobin A1C: 5.6 % (ref 4.0–5.6)

## 2022-07-25 NOTE — Assessment & Plan Note (Addendum)
Colonoscopy in 03/2021 at [facility] had no polyps removed and they recommended she follow up in 10 years. Needs repeat BMP today.  Discussed smoking and alcohol cessation if desired. Currently smokes 2 cigars a month and drinks 2 glasses of wine twice a week.

## 2022-07-25 NOTE — Progress Notes (Signed)
   CC: Routine follow up for HTN  HPI:  [name] is a 51 y.o. female with past medical history of HTN, Class 2 Obesity, Prediabetes, and Hyperlipidemia who presents for a follow up visit. Her last visit was on 06/06/2022.   Past Medical History:  Diagnosis Date   Hearing loss    Review of Systems:   No chest pain, dyspnea, fever, chills, change in bowel/bladder habits, nausea, vomiting, headaches, syncope, or presyncope.  Physical Exam:  Vitals:   07/25/22 1016 07/25/22 1132  BP: (!) 159/92 138/76  Pulse: 76 67  Temp: 98.5 F (36.9 C)   TempSrc: Oral   SpO2: 100%   Weight: 241 lb 4.8 oz (109.5 kg)   Height: 5\' 7"  (1.702 m)    Constitutional:Well appearing female sitting in a chair. In no acute distress. HENT: Normocephalic Eyes: PERRL, no obvious AV nicking or cotton wool spots on partially visualized retina bilaterally. Cardio:Regular rate and rhythm. No murmurs, rubs, or gallops. Pulm:Clear to auscultation bilaterally. Normal work of breathing on room air. Abdomen: Soft, nontender, no masses, positive bowel sounds.  ZJI:RCVELFYB for extremity edema. Skin:Warm and dry. Neuro:Alert and oriented x3. No focal deficit noted. Psych:Pleasant mood and affect.   Assessment & Plan:   See Encounters Tab for problem based charting.  Patient seen with Dr. Jimmye Norman

## 2022-07-25 NOTE — Assessment & Plan Note (Signed)
BP initially high on arrival at 152/92, recheck after visit at 138/76. No recent changes or missed medications. She had an episode of a weird fuzzy feeling and a floater in her right eye that lasted about 4 hours a few days ago. No headache, presyncope, syncope, chest pain, or dyspnea. No recurrence. Discussed the need to call our clinic or go to the ED if this recurs.  - Continue on amlodipine 10 mg daily and HCTZ 12.5 mg daily - Check your BP when able  - Return for follow up in 6 months

## 2022-07-25 NOTE — Assessment & Plan Note (Addendum)
Improved symptoms since presentation in 05/2022. Mammogram on 07/07/2022 without signs of malignancy. Likely related to breast reduction surgery in 2002. Symptomatic treatment as needed.  - Continue to monitor - Continue with screening annual mammography in 06/2023

## 2022-07-25 NOTE — Assessment & Plan Note (Signed)
Weight at current visit is up from 236 lbs to 241. Discussed lifestyle changes which she is planning to make including exercise now that her apartment gym is reopening and diet changes.   - Proceed with lifestyle modification and discuss progress at next visit

## 2022-07-25 NOTE — Patient Instructions (Addendum)
Thank you, Madison Lawrence, for allowing [place] to provide your care today. Today we discussed . . .   > Hypertension       - your blood pressure today was good on the recheck, we will continue to monitor this but we won't make any medication changes today.  > Hyperlipidemia       - we rechecked your lipid panel today and will call with the results. We discussed starting atorvastatin if your cholesterol is still elevated and will contact you about this when we have the results. > Prediabetes       - your last A1C was slightly elevated, we drew another A1C today and will call you with the results > Breast Pain       - your breast pain is likely benign, but we have ordered a bilateral diagnostic mammography for a final diagnosis > Obesity       - we discussed lifestyle modifications today including restarting an exercise routine and making diet changes to decrease sugars and processed foods. If you need any additional assistance please don't hesitate to call the clinic. > Vaccinations       - please continue with your current plan to get the COVID, Flu, and Shingles vaccine at your convenience. Call if you have any questions or issues with getting the vaccines.     I have ordered the following labs for you:   Hemoglobin A1C, Lipid Panel, Basic Metabolic Panel     Tests ordered today:   None     Referrals ordered today:    Referral Orders  No referral(s) requested today        I have ordered the following medication/changed the following medications:    Stop the following medications: There are no discontinued medications.    Start the following medications: No orders of the defined types were placed in this encounter.       Follow up: 6 months     Remember:  If you have any new or worsening issues or want any help with managing anything please call the clinic.     Should you have any questions or concerns please call the internal medicine clinic at [phone].

## 2022-07-25 NOTE — Assessment & Plan Note (Addendum)
A1c 5.8 in 09/2021.  - Repeat A1c 5.6 today.

## 2022-07-25 NOTE — Assessment & Plan Note (Signed)
10 Year ASCVD Risk Score of 5.4% based on lipid panel from 12/2020 with total cholesterol of 223. Discussed the need for a moderate intensity statin based on this. We will recheck lipid panel today and proceed based on these results.   - Lipid panel today, if still indicated start atorvastatin 20 mg daily

## 2022-07-26 ENCOUNTER — Other Ambulatory Visit: Payer: Self-pay | Admitting: Student

## 2022-07-26 DIAGNOSIS — E782 Mixed hyperlipidemia: Secondary | ICD-10-CM

## 2022-07-26 LAB — LIPID PANEL
Chol/HDL Ratio: 4.1 ratio (ref 0.0–4.4)
Cholesterol, Total: 204 mg/dL — ABNORMAL HIGH (ref 100–199)
HDL: 50 mg/dL (ref >39)
LDL Chol Calc (NIH): 132 mg/dL — ABNORMAL HIGH (ref 0–99)
Triglycerides: 120 mg/dL (ref 0–149)
VLDL Cholesterol Cal: 22 mg/dL (ref 5–40)

## 2022-07-26 LAB — BASIC METABOLIC PANEL
BUN/Creatinine Ratio: 13 (ref 9–23)
BUN: 11 mg/dL (ref 6–24)
CO2: 24 mmol/L (ref 20–29)
Calcium: 9.2 mg/dL (ref 8.7–10.2)
Chloride: 101 mmol/L (ref 96–106)
Creatinine, Ser: 0.83 mg/dL (ref 0.57–1.00)
Glucose: 94 mg/dL (ref 70–99)
Potassium: 4.2 mmol/L (ref 3.5–5.2)
Sodium: 139 mmol/L (ref 134–144)
eGFR: 86 mL/min/{1.73_m2} (ref >59)

## 2022-07-26 MED ORDER — ATORVASTATIN CALCIUM 20 MG PO TABS: 20.0000 mg | ORAL_TABLET | Freq: Every day | ORAL | 11 refills | Status: DC

## 2022-07-26 NOTE — Progress Notes (Signed)
Called the patient and discussed her lab results with her including her unremarkable BMP and her improved A1c of 5.6.  Also discussed her stable lipid profile and the need for a statin at this time.  Patient understands and did not have any questions.  We will send this medication to her pharmacy.

## 2022-07-27 LAB — GLUCOSE, CAPILLARY: Glucose-Capillary: 109 mg/dL — ABNORMAL HIGH (ref 70–99)

## 2022-07-27 NOTE — Progress Notes (Signed)
Results discussed with patient during visit as well as on a phone call documented on 07/26/2022. All questions were answered.

## 2022-08-01 NOTE — Progress Notes (Signed)
Internal Medicine Clinic Attending  I saw and evaluated the patient.  I personally confirmed the key portions of the history and exam documented by Dr. Goodwin and I reviewed pertinent patient test results.  The assessment, diagnosis, and plan were formulated together and I agree with the documentation in the resident's note.  

## 2022-09-22 IMAGING — MG MM DIGITAL SCREENING BILAT W/ TOMO AND CAD
8 series · 8 of 24 positions shown · non-contrast
Comparison: Previous exam(s).

ACR Breast Density Category a: The breast tissue is almost entirely
fatty.

CLINICAL DATA: Screening.

EXAM:
DIGITAL SCREENING BILATERAL MAMMOGRAM WITH TOMOSYNTHESIS AND CAD
TECHNIQUE: Bilateral screening digital craniocaudal and mediolateral oblique
mammograms were obtained. Bilateral screening digital breast
tomosynthesis was performed. The images were evaluated with
computer-aided detection.

[R MLO synth-2D]
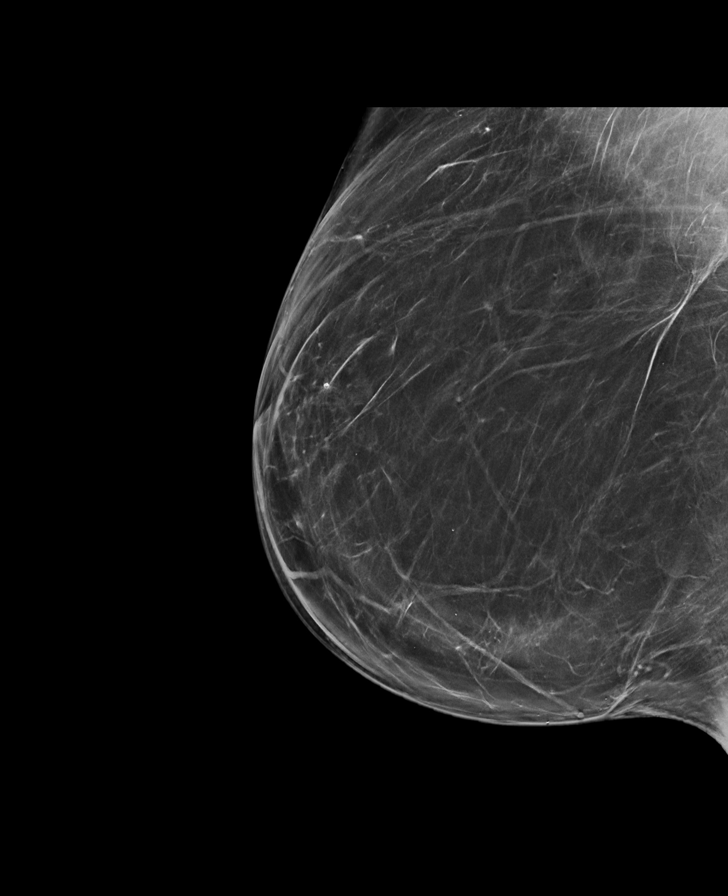

[L CC synth-2D]
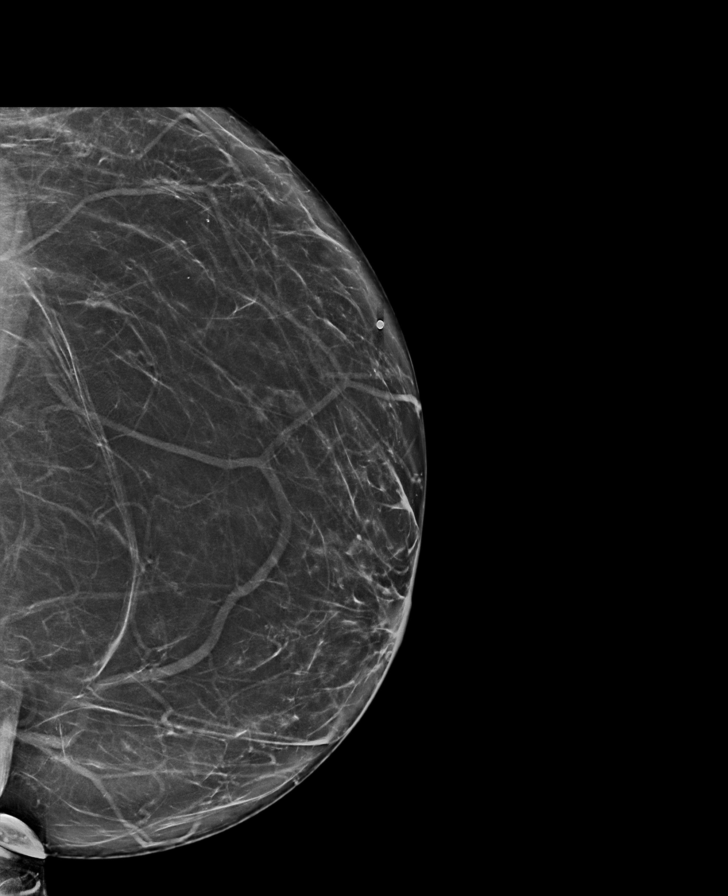

[L MLO synth-2D]
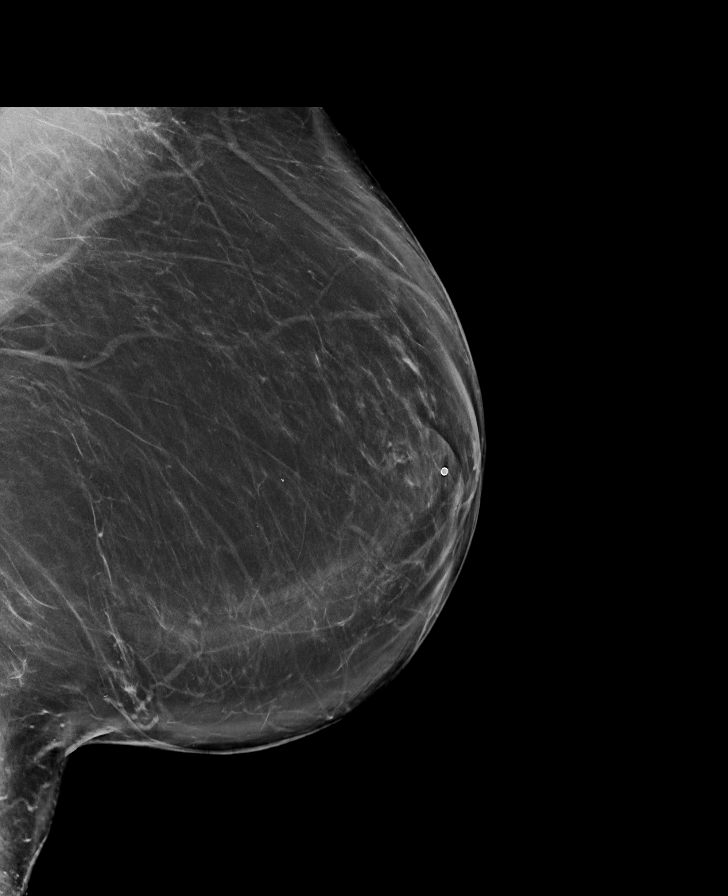

[R CC synth-2D]
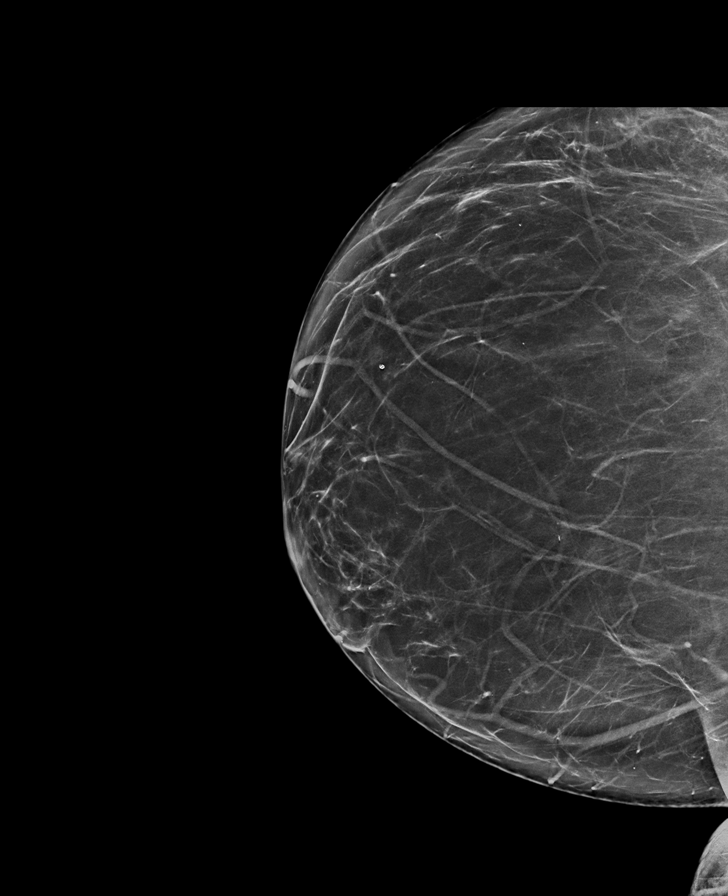

[L CC tomo · tomo slice 41/82.0]
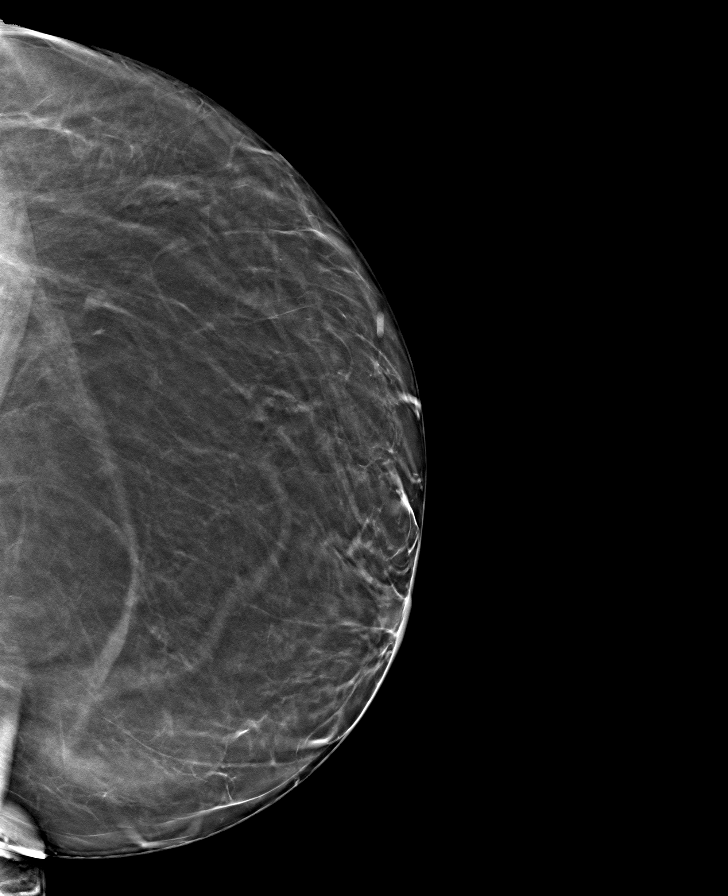

[R CC tomo · tomo slice 39/78.0]
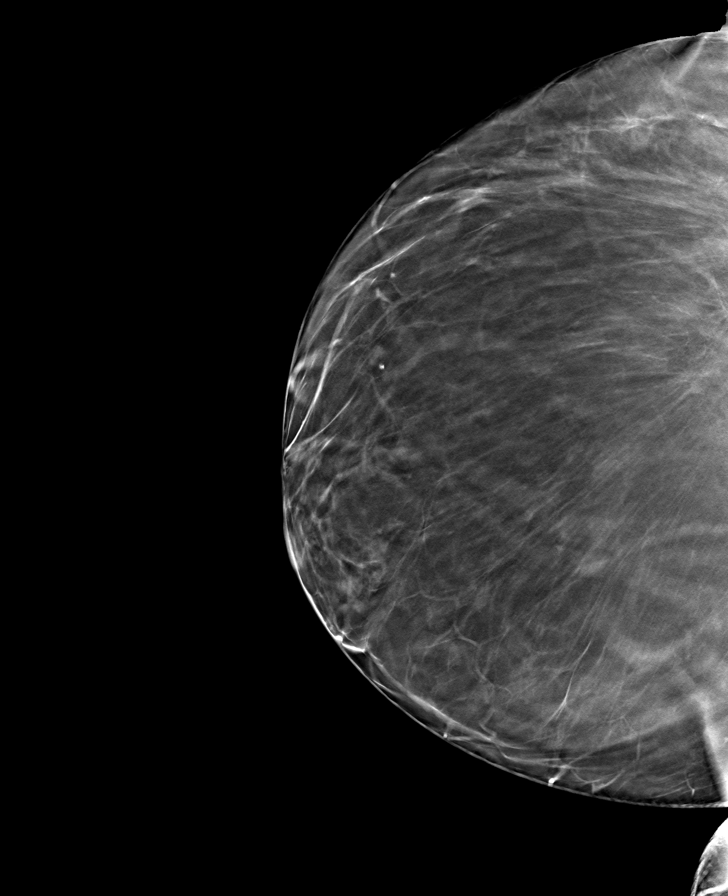

[L MLO tomo · tomo slice 49/98.0]
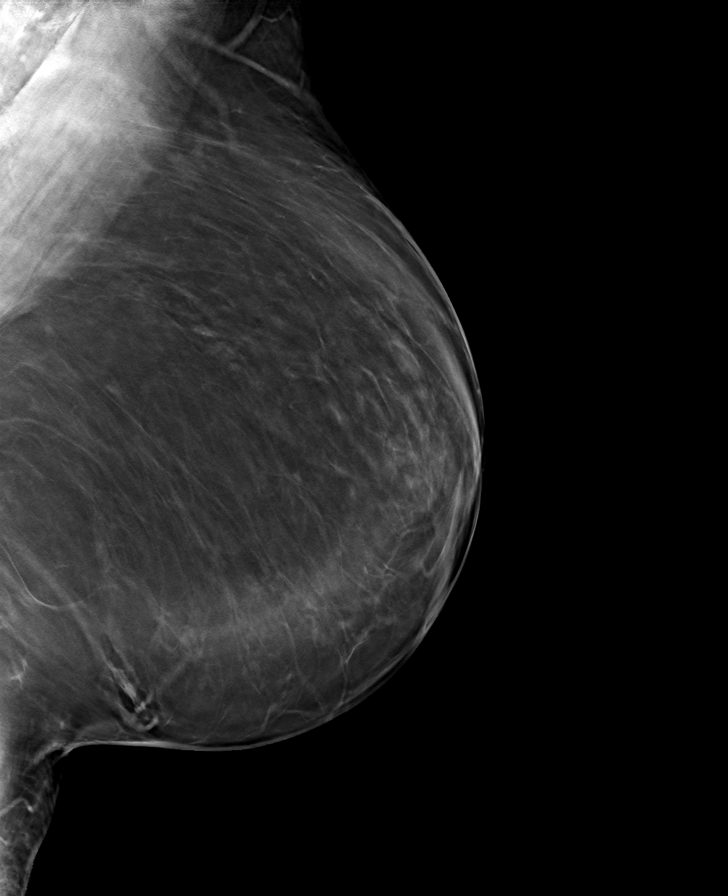

[R MLO tomo · tomo slice 47/94.0]
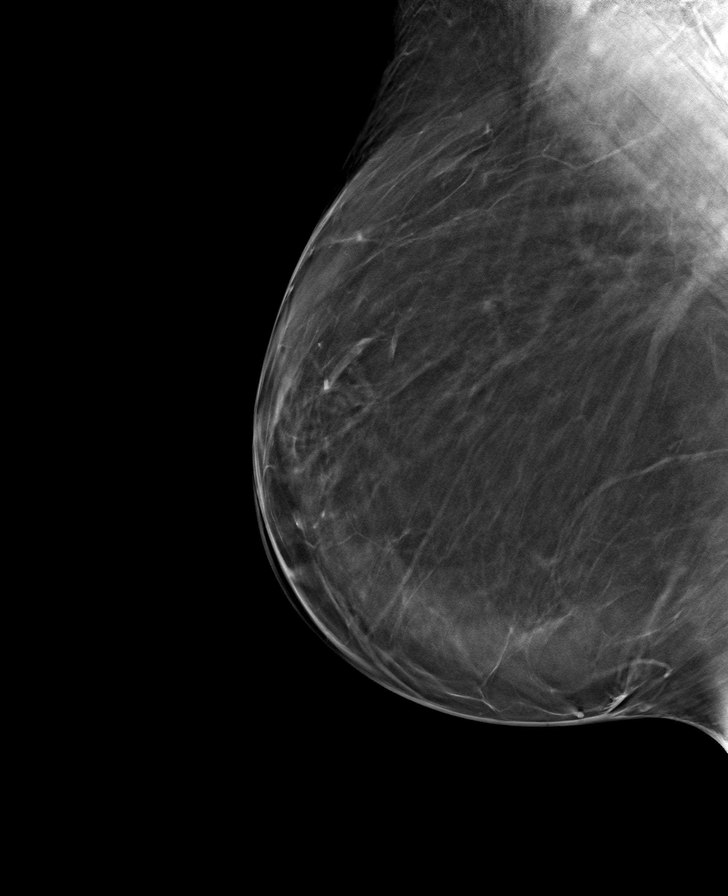

[8 of 24 positions shown; findings below may reference images not displayed]

FINDINGS: There are no findings suspicious for malignancy.
IMPRESSION: No mammographic evidence of malignancy. A result letter of this
screening mammogram will be mailed directly to the patient.

RECOMMENDATION:
Screening mammogram in one year. (Code:0E-3-N98)

BI-RADS CATEGORY  1: Negative.

## 2022-09-27 ENCOUNTER — Other Ambulatory Visit: Payer: Self-pay

## 2022-09-27 DIAGNOSIS — I10 Essential (primary) hypertension: Secondary | ICD-10-CM

## 2022-09-27 MED ORDER — HYDROCHLOROTHIAZIDE 12.5 MG PO CAPS: ORAL_CAPSULE | ORAL | 1 refills | Status: DC

## 2022-09-27 MED ORDER — AMLODIPINE BESYLATE 10 MG PO TABS: 10.0000 mg | ORAL_TABLET | Freq: Every day | ORAL | 1 refills | Status: DC

## 2022-09-27 NOTE — Telephone Encounter (Signed)
hydrochlorothiazide (MICROZIDE) 12.5 MG capsule, REFILL REQUEST @ [id number] - [place], [place] - [address] AT [facility] OF [address]

## 2023-03-26 ENCOUNTER — Other Ambulatory Visit: Payer: Self-pay | Admitting: Student

## 2023-03-26 DIAGNOSIS — I10 Essential (primary) hypertension: Secondary | ICD-10-CM

## 2023-04-12 ENCOUNTER — Other Ambulatory Visit: Payer: Self-pay | Admitting: Student

## 2023-04-12 DIAGNOSIS — I10 Essential (primary) hypertension: Secondary | ICD-10-CM

## 2023-04-20 ENCOUNTER — Ambulatory Visit (INDEPENDENT_AMBULATORY_CARE_PROVIDER_SITE_OTHER): Payer: 59 | Admitting: Student

## 2023-04-20 ENCOUNTER — Encounter: Payer: Self-pay | Admitting: Student

## 2023-04-20 VITALS — BP 123/79 | HR 79 | Ht 67.0 in | Wt 237.8 lb

## 2023-04-20 DIAGNOSIS — N951 Menopausal and female climacteric states: Secondary | ICD-10-CM | POA: Diagnosis not present

## 2023-04-20 DIAGNOSIS — E669 Obesity, unspecified: Secondary | ICD-10-CM

## 2023-04-20 DIAGNOSIS — Z6837 Body mass index (BMI) 37.0-37.9, adult: Secondary | ICD-10-CM

## 2023-04-20 DIAGNOSIS — I10 Essential (primary) hypertension: Secondary | ICD-10-CM | POA: Diagnosis not present

## 2023-04-20 DIAGNOSIS — R7303 Prediabetes: Secondary | ICD-10-CM | POA: Diagnosis not present

## 2023-04-20 DIAGNOSIS — E785 Hyperlipidemia, unspecified: Secondary | ICD-10-CM

## 2023-04-20 DIAGNOSIS — E782 Mixed hyperlipidemia: Secondary | ICD-10-CM

## 2023-04-20 DIAGNOSIS — Z780 Asymptomatic menopausal state: Secondary | ICD-10-CM

## 2023-04-20 MED ORDER — ESTROGENS CONJUGATED 0.625 MG/GM VA CREA: 1.0000 | TOPICAL_CREAM | Freq: Every day | VAGINAL | 12 refills | Status: DC

## 2023-04-20 NOTE — Assessment & Plan Note (Signed)
She is lost 4 pounds from her previous visit in 07/2022.  She is doing well on her diet and has increased the amount of vegetables she is eating.  Encouraged and counseled on calorie deficit and exercise.

## 2023-04-20 NOTE — Assessment & Plan Note (Signed)
Started on atorvastatin 20 mg daily after lipid panel 07/2022 with stable from prior.  No side effects on this medication. - Continue atorvastatin 20 mg daily - Repeat lipid panel today

## 2023-04-20 NOTE — Patient Instructions (Addendum)
  Thank you, Madison Lawrence, for allowing [place] to provide your care today. Today we discussed . . .  > Blood pressure       -Your blood pressure is perfect at 123/79.  We are not can change any medications but we will check your electrolytes and your renal function today with a blood test and I will call you with these results. > Cholesterol       -I am glad that you are doing well on the atorvastatin.  We are going to check your cholesterol today and I will call you with results. > Menopause       -I am very glad that you are symptoms of menopause have been mild so far.  We are going to start a vaginal estrogen cream that you can use daily.  If this is expensive at the pharmacy I would also recommend that you use lubricant especially for sexual intercourse. If you have any issues with this or any worsening symptoms or new symptoms including hot flashes, vaginal bleeding, or otherwise please let us know.   I have ordered the following labs for you:   Lab Orders         BMP8+Anion Gap         Lipid Profile         POC Hbg A1C       Referrals ordered today:   Referral Orders  No referral(s) requested today      I have ordered the following medication/changed the following medications:   Stop the following medications: There are no discontinued medications.   Start the following medications: Meds ordered this encounter  Medications   conjugated estrogens (PREMARIN) vaginal cream    Sig: Place 1 Applicatorful vaginally daily.    Dispense:  42.5 g    Refill:  12      Follow up: 6 months    Remember:     Should you have any questions or concerns please call the internal medicine clinic at [phone].     Rocky Morel, DO [facility]

## 2023-04-20 NOTE — Progress Notes (Signed)
   CC: Routine follow-up  HPI:  Madison Lawrence is a 52 y.o. female with PMH as below who presents to the clinic for routine follow-up.  Please see assessment plan for further details.  Past Medical History:  Diagnosis Date   Hearing loss    Review of Systems:   Pertinent items noted in HPI and/or A&P.  Physical Exam:  Vitals:   04/20/23 0939  BP: 123/79  Pulse: 79  SpO2: 100%  Weight: 237 lb 12.8 oz (107.9 kg)  Height: 5\' 7"  (1.702 m)    Constitutional: Well-appearing middle-aged female. In no acute distress. HEENT: Normocephalic, atraumatic, Sclera non-icteric, PERRL, EOM intact Cardio:Regular rate and rhythm. 2+ bilateral radial and dorsalis pedis  pulses. Pulm:Clear to auscultation bilaterally. Normal work of breathing on room air. Abdomen: Soft, non-tender, non-distended, positive bowel sounds. GHW:EXHBZJIR for extremity edema. Skin:Warm and dry. Neuro:Alert and oriented x3. No focal deficit noted. Psych:Pleasant mood and affect.   Assessment & Plan:   Essential hypertension Blood pressure continues to be well-controlled on current regimen.  Last BMP 07/2022 without abnormalities. - Continue amlodipine 10 mg daily and hydrochlorothiazide 12.5 mg daily - BMP today  Class 2 obesity She is lost 4 pounds from her previous visit in 07/2022.  She is doing well on her diet and has increased the amount of vegetables she is eating.  Encouraged and counseled on calorie deficit and exercise.  Prediabetes A1c 5.6 in 07/2022.  Will repeat today.  If stable will start to do yearly.  Hyperlipidemia Started on atorvastatin 20 mg daily after lipid panel 07/2022 with stable from prior.  No side effects on this medication. - Continue atorvastatin 20 mg daily - Repeat lipid panel today  Menopause Patient had her last menstrual cycle in 09/2022.  Since then she has had some mild vaginal dryness that has resulted in a small amount of vaginal bleeding after an episode of intercourse  but otherwise she has had no spotting, no hot flashes and only mildly labile mood.  Discussed hormonal and nonhormonal treatments.  She is willing to try vaginal estrogen cream.  She also follows with OB/GYN and will discuss her vaginal dryness with them as well. - Estrogen vaginal cream daily    Patient discussed with Dr. Julieanne Cotton, DO [facility] Internal Medicine Resident PGY-1 Pager: [phone]

## 2023-04-20 NOTE — Assessment & Plan Note (Signed)
A1c 5.6 in 07/2022.  Will repeat today.  If stable will start to do yearly.

## 2023-04-20 NOTE — Assessment & Plan Note (Signed)
Blood pressure continues to be well-controlled on current regimen.  Last BMP 07/2022 without abnormalities. - Continue amlodipine 10 mg daily and hydrochlorothiazide 12.5 mg daily - BMP today

## 2023-04-20 NOTE — Assessment & Plan Note (Signed)
Patient had her last menstrual cycle in 09/2022.  Since then she has had some mild vaginal dryness that has resulted in a small amount of vaginal bleeding after an episode of intercourse but otherwise she has had no spotting, no hot flashes and only mildly labile mood.  Discussed hormonal and nonhormonal treatments.  She is willing to try vaginal estrogen cream.  She also follows with OB/GYN and will discuss her vaginal dryness with them as well. - Estrogen vaginal cream daily

## 2023-04-21 ENCOUNTER — Encounter: Payer: Self-pay | Admitting: Student

## 2023-04-21 NOTE — Progress Notes (Signed)
Internal Medicine Clinic Attending  Case discussed with Dr. Goodwin  At the time of the visit.  We reviewed the resident's history and exam and pertinent patient test results.  I agree with the assessment, diagnosis, and plan of care documented in the resident's note.  

## 2023-04-22 LAB — BMP8+ANION GAP
Anion Gap: 13 mmol/L (ref 10.0–18.0)
BUN/Creatinine Ratio: 10 (ref 9–23)
BUN: 9 mg/dL (ref 6–24)
CO2: 25 mmol/L (ref 20–29)
Calcium: 9.9 mg/dL (ref 8.7–10.2)
Chloride: 103 mmol/L (ref 96–106)
Creatinine, Ser: 0.89 mg/dL (ref 0.57–1.00)
Glucose: 98 mg/dL (ref 70–99)
Potassium: 4.9 mmol/L (ref 3.5–5.2)
Sodium: 141 mmol/L (ref 134–144)
eGFR: 79 mL/min/{1.73_m2} (ref >59)

## 2023-04-22 LAB — LIPID PANEL
Chol/HDL Ratio: 4 ratio (ref 0.0–4.4)
Cholesterol, Total: 152 mg/dL (ref 100–199)
HDL: 38 mg/dL — ABNORMAL LOW (ref >39)
LDL Chol Calc (NIH): 89 mg/dL (ref 0–99)
Triglycerides: 139 mg/dL (ref 0–149)
VLDL Cholesterol Cal: 25 mg/dL (ref 5–40)

## 2023-04-22 LAB — HEMOGLOBIN A1C
Est. average glucose Bld gHb Est-mCnc: 137 mg/dL
Hgb A1c MFr Bld: 6.4 % — ABNORMAL HIGH (ref 4.8–5.6)

## 2023-05-02 NOTE — Progress Notes (Signed)
Called patient and discussed improved LDL, increased A1c (still pre-diabetic), and normal BMP. Plan to recheck A1c in 6 months at next visit.

## 2023-06-29 ENCOUNTER — Other Ambulatory Visit: Payer: Self-pay | Admitting: Student

## 2023-06-29 DIAGNOSIS — Z1231 Encounter for screening mammogram for malignant neoplasm of breast: Secondary | ICD-10-CM

## 2023-07-13 ENCOUNTER — Ambulatory Visit
Admission: RE | Admit: 2023-07-13 | Discharge: 2023-07-13 | Disposition: A | Discharge status: Home / Self Care | Payer: 59 | Source: Ambulatory Visit

## 2023-07-13 DIAGNOSIS — Z1231 Encounter for screening mammogram for malignant neoplasm of breast: Secondary | ICD-10-CM

## 2023-07-19 ENCOUNTER — Encounter: Payer: Self-pay | Admitting: Student

## 2023-07-19 NOTE — Progress Notes (Signed)
Results of normal mammogram mailed to patient.

## 2023-08-02 ENCOUNTER — Other Ambulatory Visit: Payer: Self-pay | Admitting: Student

## 2023-08-02 DIAGNOSIS — E782 Mixed hyperlipidemia: Secondary | ICD-10-CM

## 2023-09-22 ENCOUNTER — Other Ambulatory Visit: Payer: Self-pay | Admitting: Student

## 2023-09-22 DIAGNOSIS — I10 Essential (primary) hypertension: Secondary | ICD-10-CM

## 2023-10-12 ENCOUNTER — Other Ambulatory Visit: Payer: Self-pay | Admitting: Student

## 2023-10-12 DIAGNOSIS — I10 Essential (primary) hypertension: Secondary | ICD-10-CM

## 2023-11-01 NOTE — Progress Notes (Signed)
   CC: 6 month follow up  HPI:  [name] is a 53 y.o. with medical history of HTN, HLD, prediabetes presenting to [facility] for 6 month follow up.   Please see problem-based list for further details, assessments, and plans.  Past Medical History:  Diagnosis Date   Hearing loss      Current Outpatient Medications (Cardiovascular):    losartan  (COZAAR ) 50 MG tablet, Take 1 tablet (50 mg total) by mouth daily.   amLODipine  (NORVASC ) 5 MG tablet, Take 1 tablet (5 mg total) by mouth daily.   atorvastatin  (LIPITOR) 20 MG tablet, TAKE 1 TABLET(20 MG) BY MOUTH DAILY      Review of Systems:  Review of system negative unless stated in the problem list or HPI.    Physical Exam:  Vitals:   11/02/23 0930  BP: 131/80  Pulse: 69  Temp: 98.9 F (37.2 C)  TempSrc: Oral  SpO2: 100%  Weight: 234 lb 1.6 oz (106.2 kg)   Physical Exam General: NAD HENT: NCAT Lungs: CTAB, no wheeze, rhonchi or rales.  Cardiovascular: Normal heart sounds, no r/m/g, 2+ pulses in all extremities. No LE edema Abdomen: No TTP, normal bowel sounds GU: No lesions noted on vulva. Cervix difficult to visualize initially but appeared normal in appearance.  MSK: No asymmetry or muscle atrophy. No TTP of left foot.  Skin: no lesions noted on exposed skin Neuro: Alert and oriented x4. CN grossly intact Psych: Normal mood and normal affect   Assessment & Plan:   Essential hypertension Pt's HTN is well controlled with amlodipine  10 mg every day, and hydrochlorothiazide  12.5 mg every day. Will change pt's hydrochlorothiazide  to losartan  50 mg given potential link with hydrochlorothiazide  and hearing loss. Her hearing loss predates her hydrochlorothiazide  but to prevent any potential hearing problems will make this change. Will also decrease her amlodipine  to 5 mg every day. BMP in 4 weeks.   Prediabetes A1c 6.1 this visit. Continue to monitor at 6-12 months interval.   Foot pain, left Pt with chronic left foot  pain. No TTP on exam. States difficult to point to the exact location but pain occurs on left side of left foot after pt walks or stands. No pain in plantar surface of the foot. Pt reports plantar fasciitis in right foot. Suspect altered gait secondary to that and her obesity is leading to this pain. Will refer to podiatry as she may need custom fitted shoes to minimize pressure injury to foot. Physical exam makes fracture unlikely so will defer X ray for now. Referral to podiatry placed.   Healthcare maintenance Pap smear performed this visit.   Class 2 obesity Pt to come in one month for a dedicated visit for her weight loss options with her PCP. Discussed dietary changes with pt to create a daily calorie deficit with every 3500 calories leading to 1 lb weight loss. Pt has dietary indiscretions that include eating fast foods and drinking sodas. Encouraged to minimize these. Pt understanding.   Perimenopause Pt in perimenopause state given she is still having irregular periods. No other symptoms reported at this time.    See Encounters Tab for problem based charting.  Patient Discussed with Dr. Forest Morene Bathe, MD [facility] Internal Medicine Residency, PGY-3

## 2023-11-02 ENCOUNTER — Ambulatory Visit (INDEPENDENT_AMBULATORY_CARE_PROVIDER_SITE_OTHER): Payer: 59 | Admitting: Internal Medicine

## 2023-11-02 ENCOUNTER — Other Ambulatory Visit (HOSPITAL_COMMUNITY)
Admission: RE | Admit: 2023-11-02 | Discharge: 2023-11-02 | Disposition: A | Discharge status: Home / Self Care | Payer: 59 | Source: Ambulatory Visit | Attending: Internal Medicine | Admitting: Internal Medicine

## 2023-11-02 ENCOUNTER — Ambulatory Visit: Payer: 59 | Admitting: Podiatry

## 2023-11-02 ENCOUNTER — Encounter: Payer: Self-pay | Admitting: Internal Medicine

## 2023-11-02 VITALS — BP 131/80 | HR 69 | Temp 98.9°F | Wt 234.1 lb

## 2023-11-02 DIAGNOSIS — R7303 Prediabetes: Secondary | ICD-10-CM

## 2023-11-02 DIAGNOSIS — Z6836 Body mass index (BMI) 36.0-36.9, adult: Secondary | ICD-10-CM

## 2023-11-02 DIAGNOSIS — M79672 Pain in left foot: Secondary | ICD-10-CM | POA: Diagnosis not present

## 2023-11-02 DIAGNOSIS — Z124 Encounter for screening for malignant neoplasm of cervix: Secondary | ICD-10-CM | POA: Diagnosis present

## 2023-11-02 DIAGNOSIS — I10 Essential (primary) hypertension: Secondary | ICD-10-CM | POA: Diagnosis not present

## 2023-11-02 DIAGNOSIS — N951 Menopausal and female climacteric states: Secondary | ICD-10-CM | POA: Diagnosis not present

## 2023-11-02 DIAGNOSIS — E66812 Obesity, class 2: Secondary | ICD-10-CM

## 2023-11-02 DIAGNOSIS — Z0000 Encounter for general adult medical examination without abnormal findings: Secondary | ICD-10-CM

## 2023-11-02 LAB — POCT GLYCOSYLATED HEMOGLOBIN (HGB A1C): Hemoglobin A1C: 6.1 % — AB (ref 4.0–5.6)

## 2023-11-02 LAB — GLUCOSE, CAPILLARY: Glucose-Capillary: 95 mg/dL (ref 70–99)

## 2023-11-02 MED ORDER — LOSARTAN POTASSIUM 50 MG PO TABS: 50.0000 mg | ORAL_TABLET | Freq: Every day | ORAL | 11 refills | Status: DC

## 2023-11-02 MED ORDER — AMLODIPINE BESYLATE 5 MG PO TABS: 5.0000 mg | ORAL_TABLET | Freq: Every day | ORAL | 0 refills | Status: DC

## 2023-11-02 NOTE — Patient Instructions (Addendum)
 Ms.Karlyn Marsolek, it was a pleasure seeing you today! You endorsed feeling well today. Below are some of the things we talked about this visit. We look forward to seeing you in the follow up appointment!  Today we discussed: For your blood pressure, we will change your hydrochlorothiazide  to losartan  50 mg daily. Please check your blood pressure at home.   We will check your lab work at 4-6 weeks. At that visit we can talk about weight loss.   I am referral you to podiatry for your foot pain. Looks like you will need better fitting foot wear.   I have ordered the following labs today:  Lab Orders         POC Hbg A1C       Referrals ordered today:   Referral Orders  No referral(s) requested today     I have ordered the following medication/changed the following medications:   Stop the following medications: Medications Discontinued During This Encounter  Medication Reason   conjugated estrogens  (PREMARIN ) vaginal cream Patient has not taken in last 30 days     Start the following medications: Meds ordered this encounter  Medications   losartan  (COZAAR ) 50 MG tablet    Sig: Take 1 tablet (50 mg total) by mouth daily.    Dispense:  30 tablet    Refill:  11     Follow-up: 4-6 week follow up with Dr. Jolaine   Please make sure to arrive 15 minutes prior to your next appointment. If you arrive late, you may be asked to reschedule.   We look forward to seeing you next time. Please call our clinic at [phone] if you have any questions or concerns. The best time to call is Monday-Friday from 9am-4pm, but there is someone available 24/7. If after hours or the weekend, call the main hospital number and ask for the Internal Medicine Resident On-Call. If you need medication refills, please notify your pharmacy one week in advance and they will send us  a request.  Thank you for letting us  take part in your care. Wishing you the best!  Thank you, Morene Bathe, MD

## 2023-11-04 DIAGNOSIS — M79672 Pain in left foot: Secondary | ICD-10-CM | POA: Insufficient documentation

## 2023-11-04 NOTE — Assessment & Plan Note (Signed)
 Pt to come in one month for a dedicated visit for her weight loss options with her PCP. Discussed dietary changes with pt to create a daily calorie deficit with every 3500 calories leading to 1 lb weight loss. Pt has dietary indiscretions that include eating fast foods and drinking sodas. Encouraged to minimize these. Pt understanding.

## 2023-11-04 NOTE — Assessment & Plan Note (Signed)
 Pt in perimenopause state given she is still having irregular periods. No other symptoms reported at this time.

## 2023-11-04 NOTE — Assessment & Plan Note (Signed)
Pap smear performed this visit

## 2023-11-04 NOTE — Assessment & Plan Note (Signed)
 Pt's HTN is well controlled with amlodipine  10 mg every day, and hydrochlorothiazide  12.5 mg every day. Will change pt's hydrochlorothiazide  to losartan  50 mg given potential link with hydrochlorothiazide  and hearing loss. Her hearing loss predates her hydrochlorothiazide  but to prevent any potential hearing problems will make this change. Will also decrease her amlodipine  to 5 mg every day. BMP in 4 weeks.

## 2023-11-04 NOTE — Assessment & Plan Note (Signed)
 A1c 6.1 this visit. Continue to monitor at 6-12 months interval.

## 2023-11-04 NOTE — Assessment & Plan Note (Signed)
 Pt with chronic left foot pain. No TTP on exam. States difficult to point to the exact location but pain occurs on left side of left foot after pt walks or stands. No pain in plantar surface of the foot. Pt reports plantar fasciitis in right foot. Suspect altered gait secondary to that and her obesity is leading to this pain. Will refer to podiatry as she may need custom fitted shoes to minimize pressure injury to foot. Physical exam makes fracture unlikely so will defer X ray for now. Referral to podiatry placed.

## 2023-11-07 LAB — CYTOLOGY - PAP
Adequacy: ABSENT
Comment: NEGATIVE
Diagnosis: NEGATIVE
High risk HPV: NEGATIVE

## 2023-11-07 NOTE — Progress Notes (Signed)
 Internal Medicine Clinic Attending  Case discussed with the resident at the time of the visit.  We reviewed the resident's history and exam and pertinent patient test results.  I agree with the assessment, diagnosis, and plan of care documented in the resident's note.

## 2023-12-27 ENCOUNTER — Ambulatory Visit (INDEPENDENT_AMBULATORY_CARE_PROVIDER_SITE_OTHER): Admitting: Student

## 2023-12-27 VITALS — BP 131/89 | HR 72 | Temp 98.7°F | Ht 67.0 in | Wt 238.2 lb

## 2023-12-27 DIAGNOSIS — I10 Essential (primary) hypertension: Secondary | ICD-10-CM | POA: Diagnosis not present

## 2023-12-27 DIAGNOSIS — M79672 Pain in left foot: Secondary | ICD-10-CM

## 2023-12-27 DIAGNOSIS — E66812 Obesity, class 2: Secondary | ICD-10-CM | POA: Diagnosis not present

## 2023-12-27 DIAGNOSIS — R7303 Prediabetes: Secondary | ICD-10-CM

## 2023-12-27 DIAGNOSIS — Z0000 Encounter for general adult medical examination without abnormal findings: Secondary | ICD-10-CM

## 2023-12-27 DIAGNOSIS — E785 Hyperlipidemia, unspecified: Secondary | ICD-10-CM

## 2023-12-27 DIAGNOSIS — Z6837 Body mass index (BMI) 37.0-37.9, adult: Secondary | ICD-10-CM

## 2023-12-27 NOTE — Progress Notes (Unsigned)
 CC: Preventative Healthcare Visit  HPI:  [name] is a 53 y.o. female living with a history stated below and presents today for a preventative healthcare visit. Please see problem based assessment and plan for additional details.  Past Medical History:  Diagnosis Date   Hearing loss     Current Outpatient Medications on File Prior to Visit  Medication Sig Dispense Refill   amLODipine (NORVASC) 5 MG tablet Take 1 tablet (5 mg total) by mouth daily. 90 tablet 0   atorvastatin (LIPITOR) 20 MG tablet TAKE 1 TABLET(20 MG) BY MOUTH DAILY 30 tablet 11   losartan (COZAAR) 50 MG tablet Take 1 tablet (50 mg total) by mouth daily. 30 tablet 11   No current facility-administered medications on file prior to visit.    Family History  Problem Relation Age of Onset   High blood pressure Mother    Diabetes Mother    Hypertension Mother    Diabetes Father     Social History   Socioeconomic History   Marital status: Single    Spouse name: Not on file   Number of children: 0   Years of education: Not on file   Highest education level: Some college, no degree  Occupational History   Not on file  Tobacco Use   Smoking status: Never   Smokeless tobacco: Never  Vaping Use   Vaping status: Never Used  Substance and Sexual Activity   Alcohol use: Not Currently   Drug use: Never   Sexual activity: Not Currently    Birth control/protection: Condom  Other Topics Concern   Not on file  Social History Narrative   Not on file   Social Drivers of Health   Financial Resource Strain: Not on file  Food Insecurity: Not on file  Transportation Needs: No Transportation Needs (06/13/2019)   PRAPARE - Transportation    Lack of Transportation (Medical): No    Lack of Transportation (Non-Medical): No  Physical Activity: Not on file  Stress: Not on file  Social Connections: Not on file  Intimate Partner Violence: Not on file    Review of Systems: ROS negative except for what is  noted on the assessment and plan.  Vitals:   12/27/23 1345  BP: 131/89  Pulse: 72  Temp: 98.7 F (37.1 C)  TempSrc: Oral  SpO2: 100%  Weight: 238 lb 3.2 oz (108 kg)  Height: 5\' 7"  (1.702 m)    Physical Exam: Constitutional: well-appearing, sitting in chair, in no acute distress HENT: Normocephalic/atraumatic, sclera non-icteric Cardiovascular: regular rate and rhythm, no m/r/g, no LEE Pulmonary/Chest: normal work of breathing on room air, lungs clear to auscultation bilaterally Abdominal: soft, non-tender, non-distended, normoactive bowel sounds MSK: normal bulk and tone Skin: warm and dry  Psych: normal mood and behavior  Assessment & Plan:     Patient discussed with Dr. Cleda Daub  Essential hypertension BP is 131/89.  Managed with amlodipine 5 and losartan 50.  She had previously been on hydrochlorothiazide 12.5 but this was changed to losartan at 10/2023 office visit given potential link with HCTZ and hearing loss (per chart review, her hearing loss predates her hydrochlorothiazide but to prevent any potential hearing problems, the change was made). Amlodipine was also 10 at that visit but was decreased to 5 given that BP was well controlled.  She does not check blood pressure at home but will return in 2 weeks with ambulatory readings/cuff calibration.  She denies headaches, visual changes, chest pain. -BMP  Hyperlipidemia  Managed with Lipitor 20 and LDL from 03/2023 from 89 within goal for primary prevention. -Repeat Lipid panel  Prediabetes Recent A1c was 6.1. Diet controlled. Denies polydipsia, polyuria.   Carmina Miller, D.O. [facility], PGY-1 Phone: [phone] Date 12/28/2023 Time 8:38 AM  Essential hypertension Pt's HTN is well controlled with amlodipine 10 mg every day, and hydrochlorothiazide 12.5 mg every day. Will change pt's hydrochlorothiazide to losartan 50 mg given potential link with hydrochlorothiazide and hearing loss. Her hearing  loss predates her hydrochlorothiazide but to prevent any potential hearing problems will make this change. Will also decrease her amlodipine to 5 mg every day. BMP in 4 weeks.    Prediabetes A1c 6.1 this visit. Continue to monitor at 6-12 months interval.    Foot pain, left Pt with chronic left foot pain. No TTP on exam. States difficult to point to the exact location but pain occurs on left side of left foot after pt walks or stands. No pain in plantar surface of the foot. Pt reports plantar fasciitis in right foot. Suspect altered gait secondary to that and her obesity is leading to this pain. Will refer to podiatry as she may need custom fitted shoes to minimize pressure injury to foot. Physical exam makes fracture unlikely so will defer X ray for now. Referral to podiatry placed.    Healthcare maintenance Pap smear performed this visit.    Class 2 obesity Pt to come in one month for a dedicated visit for her weight loss options with her PCP. Discussed dietary changes with pt to create a daily calorie deficit with every 3500 calories leading to 1 lb weight loss. Pt has dietary indiscretions that include eating fast foods and drinking sodas. Encouraged to minimize these. Pt understanding.    Perimenopause Pt in perimenopause state given she is still having irregular periods. No other symptoms reported at this time.

## 2023-12-28 ENCOUNTER — Encounter: Payer: Self-pay | Admitting: Student

## 2023-12-28 LAB — BASIC METABOLIC PANEL
BUN/Creatinine Ratio: 12 (ref 9–23)
BUN: 10 mg/dL (ref 6–24)
CO2: 24 mmol/L (ref 20–29)
Calcium: 9.1 mg/dL (ref 8.7–10.2)
Chloride: 105 mmol/L (ref 96–106)
Creatinine, Ser: 0.86 mg/dL (ref 0.57–1.00)
Glucose: 90 mg/dL (ref 70–99)
Potassium: 4.2 mmol/L (ref 3.5–5.2)
Sodium: 141 mmol/L (ref 134–144)
eGFR: 82 mL/min/{1.73_m2} (ref >59)

## 2023-12-28 LAB — LIPID PANEL
Chol/HDL Ratio: 3.2 ratio (ref 0.0–4.4)
Cholesterol, Total: 147 mg/dL (ref 100–199)
HDL: 46 mg/dL (ref >39)
LDL Chol Calc (NIH): 80 mg/dL (ref 0–99)
Triglycerides: 117 mg/dL (ref 0–149)
VLDL Cholesterol Cal: 21 mg/dL (ref 5–40)

## 2023-12-28 NOTE — Assessment & Plan Note (Signed)
 Recent A1c was 6.1. Diet controlled. Denies polydipsia, polyuria.

## 2023-12-28 NOTE — Assessment & Plan Note (Signed)
 Managed with Lipitor 20 and LDL from 03/2023 from 89 within goal for primary prevention. -Repeat Lipid panel

## 2023-12-28 NOTE — Assessment & Plan Note (Signed)
 BP is 131/89.  Managed with amlodipine 5 and losartan 50.  She had previously been on hydrochlorothiazide 12.5 but this was changed to losartan at 10/2023 office visit given potential link with HCTZ and hearing loss (per chart review, her hearing loss predates her hydrochlorothiazide but to prevent any potential hearing problems, the change was made). Amlodipine was also 10 at that visit but was decreased to 5 given that BP was well controlled.  She does not check blood pressure at home but will return in 2 weeks with ambulatory readings/cuff calibration.  She denies headaches, visual changes, chest pain. -BMP

## 2024-01-02 NOTE — Progress Notes (Signed)
 Internal Medicine Clinic Attending  Case discussed with the resident at the time of the visit.  We reviewed the resident's history and exam and pertinent patient test results.  I agree with the assessment, diagnosis, and plan of care documented in the resident's note.

## 2024-01-11 ENCOUNTER — Ambulatory Visit

## 2024-01-15 ENCOUNTER — Ambulatory Visit: Admitting: *Deleted

## 2024-01-15 NOTE — Progress Notes (Unsigned)
    Madison Lawrence presented today for blood pressure check. Patient is prescribed blood pressure medications and I confirmed that patient did take their blood pressure medication prior to today's appointment. Blood pressure was taken in the usual and appropriate manner using an automated BP cuff.     Vitals:   01/15/24 1032  BP: 115/76      Results of today's visit will be routed to Dr. Annie Paras  for review and further management.

## 2024-01-17 ENCOUNTER — Telehealth: Payer: Self-pay | Admitting: *Deleted

## 2024-01-17 NOTE — Telephone Encounter (Signed)
 Copied from CRM [id number]. Topic: Clinical - Medical Advice >> Jan 17, 2024 10:38 AM [name] wrote: Reason for CRM: Pt stated that she told the dr that she was around someone that had covid, and dr stated that if she started to feel ill that she would send something in for her. Patient states that she has been coughing, has a headache, and aching.

## 2024-02-02 ENCOUNTER — Other Ambulatory Visit: Payer: Self-pay | Admitting: Internal Medicine

## 2024-02-02 DIAGNOSIS — I10 Essential (primary) hypertension: Secondary | ICD-10-CM

## 2024-07-11 ENCOUNTER — Telehealth: Payer: Self-pay

## 2024-07-11 NOTE — Telephone Encounter (Signed)
 error

## 2024-07-12 ENCOUNTER — Ambulatory Visit (HOSPITAL_BASED_OUTPATIENT_CLINIC_OR_DEPARTMENT_OTHER)
Admission: RE | Admit: 2024-07-12 | Discharge: 2024-07-12 | Disposition: A | Discharge status: Home / Self Care | Source: Ambulatory Visit | Attending: Internal Medicine | Admitting: Internal Medicine

## 2024-07-12 ENCOUNTER — Other Ambulatory Visit (HOSPITAL_BASED_OUTPATIENT_CLINIC_OR_DEPARTMENT_OTHER): Payer: Self-pay | Admitting: Student

## 2024-07-12 ENCOUNTER — Other Ambulatory Visit (HOSPITAL_BASED_OUTPATIENT_CLINIC_OR_DEPARTMENT_OTHER): Payer: Self-pay | Admitting: Internal Medicine

## 2024-07-12 ENCOUNTER — Other Ambulatory Visit (HOSPITAL_BASED_OUTPATIENT_CLINIC_OR_DEPARTMENT_OTHER): Payer: Self-pay

## 2024-07-12 ENCOUNTER — Encounter (HOSPITAL_BASED_OUTPATIENT_CLINIC_OR_DEPARTMENT_OTHER): Payer: Self-pay | Admitting: Radiology

## 2024-07-12 DIAGNOSIS — Z1231 Encounter for screening mammogram for malignant neoplasm of breast: Secondary | ICD-10-CM

## 2024-07-31 ENCOUNTER — Other Ambulatory Visit: Payer: Self-pay

## 2024-07-31 ENCOUNTER — Encounter: Payer: Self-pay | Admitting: Student

## 2024-07-31 ENCOUNTER — Ambulatory Visit (INDEPENDENT_AMBULATORY_CARE_PROVIDER_SITE_OTHER): Admitting: Student

## 2024-07-31 VITALS — BP 136/83 | HR 77 | Temp 98.3°F | Ht 67.0 in | Wt 245.0 lb

## 2024-07-31 DIAGNOSIS — I10 Essential (primary) hypertension: Secondary | ICD-10-CM | POA: Diagnosis not present

## 2024-07-31 DIAGNOSIS — R7303 Prediabetes: Secondary | ICD-10-CM | POA: Diagnosis not present

## 2024-07-31 DIAGNOSIS — Z79899 Other long term (current) drug therapy: Secondary | ICD-10-CM

## 2024-07-31 DIAGNOSIS — Z833 Family history of diabetes mellitus: Secondary | ICD-10-CM

## 2024-07-31 DIAGNOSIS — E66812 Obesity, class 2: Secondary | ICD-10-CM | POA: Diagnosis not present

## 2024-07-31 DIAGNOSIS — H6120 Impacted cerumen, unspecified ear: Secondary | ICD-10-CM

## 2024-07-31 DIAGNOSIS — R600 Localized edema: Secondary | ICD-10-CM

## 2024-07-31 DIAGNOSIS — Z8249 Family history of ischemic heart disease and other diseases of the circulatory system: Secondary | ICD-10-CM

## 2024-07-31 DIAGNOSIS — Z6838 Body mass index (BMI) 38.0-38.9, adult: Secondary | ICD-10-CM | POA: Diagnosis not present

## 2024-07-31 DIAGNOSIS — H6122 Impacted cerumen, left ear: Secondary | ICD-10-CM

## 2024-07-31 LAB — POCT GLYCOSYLATED HEMOGLOBIN (HGB A1C): HbA1c, POC (prediabetic range): 5.9 % (ref 5.7–6.4)

## 2024-07-31 LAB — GLUCOSE, CAPILLARY: Glucose-Capillary: 87 mg/dL (ref 70–99)

## 2024-07-31 MED ORDER — TIRZEPATIDE-WEIGHT MANAGEMENT 2.5 MG/0.5ML ~~LOC~~ SOLN: 2.5000 mg | SUBCUTANEOUS | 0 refills | Status: AC

## 2024-07-31 MED ORDER — TIRZEPATIDE-WEIGHT MANAGEMENT 5 MG/0.5ML ~~LOC~~ SOLN: 5.0000 mg | SUBCUTANEOUS | 0 refills | Status: AC

## 2024-07-31 MED ORDER — TIRZEPATIDE-WEIGHT MANAGEMENT 2.5 MG/0.5ML ~~LOC~~ SOAJ: 2.5000 mg | SUBCUTANEOUS | 0 refills | Status: AC

## 2024-07-31 NOTE — Progress Notes (Signed)
 CC: Routine Follow Up for management of chronic medical conditions after last office visit 12/27/2023  HPI:  Madison Lawrence is a 53 y.o. female with pertinent PMH of hypertension, prediabetes, hyperlipidemia, and class II obesity who presents as above. Please see assessment and plan below for further details.  Medications: Current Outpatient Medications  Medication Instructions   amLODipine  (NORVASC ) 5 MG tablet TAKE 1 TABLET(5 MG) BY MOUTH DAILY   atorvastatin  (LIPITOR) 20 MG tablet TAKE 1 TABLET(20 MG) BY MOUTH DAILY   losartan  (COZAAR ) 50 mg, Oral, Daily   tirzepatide (ZEPBOUND) 2.5 mg, Subcutaneous, Weekly   tirzepatide (ZEPBOUND) 2.5 mg, Subcutaneous, Weekly   [START ON 08/30/2024] tirzepatide 5 mg, Subcutaneous, Weekly     Review of Systems:   Pertinent items noted in HPI and/or A&P.  Physical Exam:  Vitals:   07/31/24 1355  BP: 136/83  Pulse: 77  Temp: 98.3 F (36.8 C)  TempSrc: Oral  SpO2: 100%  Weight: 245 lb (111.1 kg)  Height: 5' 7 (1.702 m)    Constitutional: Well-appearing adult female. In no acute distress. HEENT: Normocephalic, atraumatic, Sclera non-icteric, PERRL, EOM intact.  Normal right ear canal and TM.  Left ear canal with moderate amount of cerumen obstructing the view of the left TM Cardio:Regular rate and rhythm. 2+ bilateral radial pulses. Pulm:Clear to auscultation bilaterally. Normal work of breathing on room air. FDX:Wzhjupcz for extremity edema. Skin:Warm and dry. Neuro:Alert and oriented x3. No focal deficit noted. Psych:Pleasant mood and affect.   Assessment & Plan:   Assessment & Plan Essential hypertension Blood pressure remains stable at 136/83.  Currently on amlodipine  and losartan .  BMP today shows stable electrolytes and renal function.  She is also trying to lose weight and this will likely improve her blood pressure. - Continue amlodipine  5 mg daily and losartan  50 mg daily Class 2 obesity Prediabetes Patient continues to  desire weight loss and in the past we have been unable to get a GLP-1 agonist covered.  She has had 10 pounds of weight gain since her last visit.  We discussed tirzepatide through [facility] and she is agreeable to this.  Discussed her diet and exercise.  She does have some significant calorie intake from juices and we will try to work on decreasing this.  She also has exercise limitations by chronic knee pain but in the past has done exercise 2 to 3 times a week and she will try to get back to this.  Rechecked A1c today which had improved from 6.1-5.9. - Start tirzepatide 2.5 mg weekly and increase to 5 mg weekly after 4 weeks if tolerating Lower extremity edema At the end of the visit when discussing the AVS the patient told me about some posterior right knee pains that she has been having.  She does note some intermittent swelling in her lower extremities but sometimes thinks that her left is bigger than the right.  On exam there was limited by tight long pants there appears to be no unequal swelling and no tenderness to palpation in the bilateral calfs.  There is some mild tenderness to palpation in her posterior right knee.  Well score is 0.  Seems more consistent with possible Baker's cyst than DVT however due to the limited discussion we we will send her for a DVT study.  Also discussed compression therapy which we will recommend that she waits until undergoing lower extremity DVT study before starting. - Lower extremity DVT ultrasound - After the start compression therapy,  strict return precautions Impacted cerumen, unspecified laterality Patient has chronic hearing loss in the left ear and had intermittent tinnitus for the past several years but recently felt like the ear is more full.  She denies any pain, fever, or other symptoms.  On exam she does have increased cerumen that obscures the TM.  Low suspicion for infection.  Discussed carbamide peroxide drops which she will try. -  Carbamide peroxide drops daily  Orders Placed This Encounter  Procedures   Basic metabolic panel with GFR   Glucose, capillary   POC Hbg A1C     Return in about 3 months (around 10/31/2024) for Follow up with PCP for HTN .   Patient discussed with Dr. Ronnald Sergeant  Fairy Pool, DO [facility] Internal [facility] Resident PGY-3 Clinic Phone: [phone] Please contact the on call pager at [phone] for any urgent or emergent needs.

## 2024-07-31 NOTE — Patient Instructions (Addendum)
 Thank you, [name], for allowing us  to provide your care today. Today we discussed . . .  > Weight loss       - We are going to start tirzepatide 2.5 mg weekly today.  I will try to send in the regular pen to the pharmacy but this might be upwards of $1000 if it is not covered.  I will also send in the vials through [facility] and you should get a text or email to order it.  Please let me know if you do not receive this in the next few days.  If you develop any nausea, vomiting, or stomach pain that does not go away or is too bothersome to continue taking the medicine please let me know.  Please continue to work on your diet and exercise in addition to starting this medicine as all 3 together are better than any 1 alone. > High blood pressure       - Your blood pressure continues to look good and we will not change her medications today.  We will check your kidney function and electrolytes today and I will call you if there is any abnormal values.  Earwax-please use carbamide peroxide or Debrox drops in your ears for the next several days and if this is not getting any better you can call us  to make an appointment to help wash out your ear.  Leg swelling-I will send an order for a lower extremity ultrasound that looks for blood clots to make sure that is not going on.  The other thing that could be causing pain behind your knee could be an Bakers cyst but I think looking for the blood clot first would be the best thing to do due to this being the most dangerous.  As we talked about you do not need to change anything you are doing right now and when we have the results of that we will talk about what to do next.  I do agree that you should start wearing compression socks as they can also help with muscle fatigue as you begin to exercise more again.  I have ordered the following labs for you:   Lab Orders         POCT glycosylated hemoglobin (Hb A1C)      Follow up: 3 months    Remember:   Should you have any questions or concerns please call the internal medicine clinic at [phone].     Fairy Pool, DO [facility]

## 2024-08-01 LAB — BASIC METABOLIC PANEL WITH GFR
BUN/Creatinine Ratio: 11 (ref 9–23)
BUN: 10 mg/dL (ref 6–24)
CO2: 21 mmol/L (ref 20–29)
Calcium: 9.1 mg/dL (ref 8.7–10.2)
Chloride: 103 mmol/L (ref 96–106)
Creatinine, Ser: 0.89 mg/dL (ref 0.57–1.00)
Glucose: 85 mg/dL (ref 70–99)
Potassium: 4.2 mmol/L (ref 3.5–5.2)
Sodium: 140 mmol/L (ref 134–144)
eGFR: 78 mL/min/1.73 (ref >59)

## 2024-08-02 ENCOUNTER — Telehealth: Payer: Self-pay

## 2024-08-02 NOTE — Telephone Encounter (Signed)
 Dear Madison Lawrence, On behalf of [employer], [employer] is responsible for reviewing pharmacy services provided to Madison Lawrence members. We received a request from your prescriber for coverage of Zepbound Inj 2.5/0.5. We reviewed all of the information you and/or your doctor sent to us  and sent the information to an appropriate physician specialist if needed. Unfortunately, we must deny coverage for Zepbound. Why was my request denied? This request was denied because you did not meet the following requirements: The requested medication and/or diagnosis are not a covered benefit and excluded from coverage in accordance with the terms and conditions of your plan benefit. Therefore, the request has been administratively denied.

## 2024-08-02 NOTE — Telephone Encounter (Signed)
 Prior Authorization for patient (Zepbound 2.5MG /0.5ML pen-injectors) came through on cover my meds was submitted with last office notes awaiting approval or denial.  [id number]

## 2024-08-03 DIAGNOSIS — R600 Localized edema: Secondary | ICD-10-CM | POA: Insufficient documentation

## 2024-08-03 NOTE — Assessment & Plan Note (Signed)
 At the end of the visit when discussing the AVS the patient told me about some posterior right knee pains that she has been having.  She does note some intermittent swelling in her lower extremities but sometimes thinks that her left is bigger than the right.  On exam there was limited by tight long pants there appears to be no unequal swelling and no tenderness to palpation in the bilateral calfs.  There is some mild tenderness to palpation in her posterior right knee.  Well score is 0.  Seems more consistent with possible Baker's cyst than DVT however due to the limited discussion we we will send her for a DVT study.  Also discussed compression therapy which we will recommend that she waits until undergoing lower extremity DVT study before starting. - Lower extremity DVT ultrasound - After the start compression therapy, strict return precautions

## 2024-08-03 NOTE — Assessment & Plan Note (Signed)
 Blood pressure remains stable at 136/83.  Currently on amlodipine  and losartan .  BMP today shows stable electrolytes and renal function.  She is also trying to lose weight and this will likely improve her blood pressure. - Continue amlodipine  5 mg daily and losartan  50 mg daily

## 2024-08-03 NOTE — Assessment & Plan Note (Signed)
 Patient continues to desire weight loss and in the past we have been unable to get a GLP-1 agonist covered.  She has had 10 pounds of weight gain since her last visit.  We discussed tirzepatide through [facility] and she is agreeable to this.  Discussed her diet and exercise.  She does have some significant calorie intake from juices and we will try to work on decreasing this.  She also has exercise limitations by chronic knee pain but in the past has done exercise 2 to 3 times a week and she will try to get back to this.  Rechecked A1c today which had improved from 6.1-5.9. - Start tirzepatide 2.5 mg weekly and increase to 5 mg weekly after 4 weeks if tolerating

## 2024-08-11 ENCOUNTER — Other Ambulatory Visit: Payer: Self-pay | Admitting: Student

## 2024-08-11 DIAGNOSIS — E782 Mixed hyperlipidemia: Secondary | ICD-10-CM

## 2024-08-12 NOTE — Telephone Encounter (Signed)
 Medication sent to pharmacy

## 2024-08-14 ENCOUNTER — Ambulatory Visit (HOSPITAL_COMMUNITY)
Admission: RE | Admit: 2024-08-14 | Discharge: 2024-08-14 | Disposition: A | Discharge status: Home / Self Care | Source: Ambulatory Visit | Attending: Internal Medicine | Admitting: Internal Medicine

## 2024-08-14 ENCOUNTER — Ambulatory Visit: Payer: Self-pay | Admitting: Student

## 2024-08-14 DIAGNOSIS — R600 Localized edema: Secondary | ICD-10-CM | POA: Diagnosis present

## 2024-08-14 NOTE — Progress Notes (Signed)
 Lower extremity venous Dopplers without any evidence of DVT or Baker's cyst.  Will send a MyChart message to the patient and now encouraged the patient to use lower extremity compression therapy.

## 2024-08-14 NOTE — Progress Notes (Signed)
 Internal Medicine Clinic Attending  Case discussed with the resident at the time of the visit.  We reviewed the resident's history and exam and pertinent patient test results.  I agree with the assessment, diagnosis, and plan of care documented in the resident's note.

## 2024-11-12 ENCOUNTER — Other Ambulatory Visit: Payer: Self-pay | Admitting: Internal Medicine

## 2024-11-12 DIAGNOSIS — I10 Essential (primary) hypertension: Secondary | ICD-10-CM

## 2024-11-12 NOTE — Telephone Encounter (Signed)
 Medication sent to pharmacy
